# Patient Record
Sex: Male | Born: 1960 | Race: White | Hispanic: No | Marital: Married | Smoking: Never smoker
Health system: Southern US, Community
[De-identification: ages and names within clinical notes are randomized; demographics above are authoritative.]

## PROBLEM LIST (undated history)

## (undated) DIAGNOSIS — M1990 Unspecified osteoarthritis, unspecified site: Secondary | ICD-10-CM

## (undated) DIAGNOSIS — I10 Essential (primary) hypertension: Secondary | ICD-10-CM

## (undated) DIAGNOSIS — E785 Hyperlipidemia, unspecified: Secondary | ICD-10-CM

## (undated) HISTORY — PX: COLONOSCOPY: SHX174

## (undated) HISTORY — DX: Essential (primary) hypertension: I10

## (undated) HISTORY — DX: Hyperlipidemia, unspecified: E78.5

---

## 2004-02-14 ENCOUNTER — Encounter: Admission: RE | Admit: 2004-02-14 | Discharge: 2004-02-14 | Payer: Self-pay | Admitting: Family Medicine

## 2017-08-13 ENCOUNTER — Encounter: Payer: Self-pay | Admitting: Gastroenterology

## 2017-10-22 ENCOUNTER — Encounter: Payer: Self-pay | Admitting: Gastroenterology

## 2017-11-09 ENCOUNTER — Encounter: Payer: Self-pay | Admitting: Gastroenterology

## 2017-12-29 ENCOUNTER — Encounter: Payer: Self-pay | Admitting: Gastroenterology

## 2018-01-18 ENCOUNTER — Encounter: Payer: Self-pay | Admitting: Gastroenterology

## 2018-03-01 ENCOUNTER — Ambulatory Visit (AMBULATORY_SURGERY_CENTER): Payer: Self-pay | Admitting: *Deleted

## 2018-03-01 ENCOUNTER — Other Ambulatory Visit: Payer: Self-pay

## 2018-03-01 VITALS — Ht 68.0 in | Wt 187.4 lb

## 2018-03-01 DIAGNOSIS — Z1211 Encounter for screening for malignant neoplasm of colon: Secondary | ICD-10-CM

## 2018-03-01 MED ORDER — NA SULFATE-K SULFATE-MG SULF 17.5-3.13-1.6 GM/177ML PO SOLN: 1.0000 [IU] | Freq: Once | ORAL | 0 refills | Status: AC

## 2018-03-01 NOTE — Progress Notes (Signed)
No egg or soy allergy known to patient  No issues with past sedation with any surgeries  or procedures, no intubation problems  No diet pills per patient No home 02 use per patient  No blood thinners per patient  Pt denies issues with constipation  No A fib or A flutter  EMMI video sent to pt's e mail  Will watch video with wife.  She is scheduled also.

## 2018-03-03 ENCOUNTER — Encounter: Payer: Self-pay | Admitting: Gastroenterology

## 2018-03-15 ENCOUNTER — Encounter: Payer: Self-pay | Admitting: Gastroenterology

## 2018-03-15 ENCOUNTER — Ambulatory Visit (AMBULATORY_SURGERY_CENTER): Payer: Managed Care, Other (non HMO) | Admitting: Gastroenterology

## 2018-03-15 ENCOUNTER — Other Ambulatory Visit: Payer: Self-pay

## 2018-03-15 VITALS — BP 127/91 | HR 81 | Temp 99.6°F | Resp 12 | Ht 68.0 in | Wt 180.0 lb

## 2018-03-15 DIAGNOSIS — K635 Polyp of colon: Secondary | ICD-10-CM | POA: Diagnosis not present

## 2018-03-15 DIAGNOSIS — Z1211 Encounter for screening for malignant neoplasm of colon: Secondary | ICD-10-CM

## 2018-03-15 DIAGNOSIS — D124 Benign neoplasm of descending colon: Secondary | ICD-10-CM

## 2018-03-15 MED ORDER — SODIUM CHLORIDE 0.9 % IV SOLN: 500.0000 mL | Freq: Once | INTRAVENOUS | Status: AC

## 2018-03-15 NOTE — Op Note (Signed)
[facility] Patient Name: Derrick Tran Procedure Date: 03/15/2018 9:16 AM MRN: [id number] Endoscopist: Mauri Pole , MD Age: 57 Referring MD:  Date of Birth: 1961/01/11 Gender: Male Account #: [id number] Procedure:                Colonoscopy Indications:              Screening for colorectal malignant neoplasm Medicines:                Monitored Anesthesia Care Procedure:                Pre-Anesthesia Assessment:                           - Prior to the procedure, a History and Physical                            was performed, and patient medications and                            allergies were reviewed. The patient's tolerance of                            previous anesthesia was also reviewed. The risks                            and benefits of the procedure and the sedation                            options and risks were discussed with the patient.                            All questions were answered, and informed consent                            was obtained. Prior Anticoagulants: The patient has                            taken no previous anticoagulant or antiplatelet                            agents. ASA Grade Assessment: II - A patient with                            mild systemic disease. After reviewing the risks                            and benefits, the patient was deemed in                            satisfactory condition to undergo the procedure.                           After obtaining informed consent, the colonoscope  was passed under direct vision. Throughout the                            procedure, the patient's blood pressure, pulse, and                            oxygen saturations were monitored continuously. The                            Colonoscope was introduced through the anus and                            advanced to the the cecum, identified by                            appendiceal orifice and  ileocecal valve. The                            colonoscopy was performed without difficulty. The                            patient tolerated the procedure well. The quality                            of the bowel preparation was excellent. The                            ileocecal valve, appendiceal orifice, and rectum                            were photographed. Scope In: 9:19:47 AM Scope Out: 9:39:22 AM Scope Withdrawal Time: 0 hours 17 minutes 0 seconds  Total Procedure Duration: 0 hours 19 minutes 35 seconds  Findings:                 The perianal and digital rectal examinations were                            normal.                           A 5 mm polyp was found in the descending colon. The                            polyp was sessile. The polyp was removed with a                            cold snare. Resection and retrieval were complete.                           Multiple small and large-mouthed diverticula were                            found in the sigmoid colon and descending colon.  Non-bleeding internal hemorrhoids were found during                            retroflexion. The hemorrhoids were small. Complications:            No immediate complications. Estimated Blood Loss:     Estimated blood loss was minimal. Impression:               - One 5 mm polyp in the descending colon, removed                            with a cold snare. Resected and retrieved.                           - Mild diverticulosis in the sigmoid colon and in                            the descending colon.                           - Non-bleeding internal hemorrhoids. Recommendation:           - Patient has a contact number available for                            emergencies. The signs and symptoms of potential                            delayed complications were discussed with the                            patient. Return to normal activities tomorrow.                             Written discharge instructions were provided to the                            patient.                           - Resume previous diet.                           - Continue present medications.                           - Await pathology results.                           - Repeat colonoscopy in 5-10 years for surveillance                            based on pathology results. Mauri Pole, MD 03/15/2018 9:42:44 AM This report has been signed electronically.

## 2018-03-15 NOTE — Progress Notes (Signed)
Report given to PACU, vss 

## 2018-03-15 NOTE — Patient Instructions (Signed)
INFORMATION ON POLYPS, DIVERTICULOSIS, HEMORRHOIDS GIVEN.   YOU HAD AN ENDOSCOPIC PROCEDURE TODAY AT [facility]:   Refer to the procedure report that was given to you for any specific questions about what was found during the examination.  If the procedure report does not answer your questions, please call your gastroenterologist to clarify.  If you requested that your care partner not be given the details of your procedure findings, then the procedure report has been included in a sealed envelope for you to review at your convenience later.  YOU SHOULD EXPECT: Some feelings of bloating in the abdomen. Passage of more gas than usual.  Walking can help get rid of the air that was put into your GI tract during the procedure and reduce the bloating. If you had a lower endoscopy (such as a colonoscopy or flexible sigmoidoscopy) you may notice spotting of blood in your stool or on the toilet paper. If you underwent a bowel prep for your procedure, you may not have a normal bowel movement for a few days.  Please Note:  You might notice some irritation and congestion in your nose or some drainage.  This is from the oxygen used during your procedure.  There is no need for concern and it should clear up in a day or so.  SYMPTOMS TO REPORT IMMEDIATELY:   Following lower endoscopy (colonoscopy or flexible sigmoidoscopy):  Excessive amounts of blood in the stool  Significant tenderness or worsening of abdominal pains  Swelling of the abdomen that is new, acute  Fever of 100F or higher For urgent or emergent issues, a gastroenterologist can be reached at any hour by calling [phone].   DIET:  We do recommend a small meal at first, but then you may proceed to your regular diet.  Drink plenty of fluids but you should avoid alcoholic beverages for 24 hours.  ACTIVITY:  You should plan to take it easy for the rest of today and you should NOT DRIVE or use heavy machinery until tomorrow  (because of the sedation medicines used during the test).    FOLLOW UP: Our staff will call the number listed on your records the next business day following your procedure to check on you and address any questions or concerns that you may have regarding the information given to you following your procedure. If we do not reach you, we will leave a message.  However, if you are feeling well and you are not experiencing any problems, there is no need to return our call.  We will assume that you have returned to your regular daily activities without incident.  If any biopsies were taken you will be contacted by phone or by letter within the next 1-3 weeks.  Please call us at [phone] if you have not heard about the biopsies in 3 weeks.    SIGNATURES/CONFIDENTIALITY: You and/or your care partner have signed paperwork which will be entered into your electronic medical record.  These signatures attest to the fact that that the information above on your After Visit Summary has been reviewed and is understood.  Full responsibility of the confidentiality of this discharge information lies with you and/or your care-partner.

## 2018-03-15 NOTE — Progress Notes (Signed)
Called to room to assist during endoscopic procedure.  Patient ID and intended procedure confirmed with present staff. Received instructions for my participation in the procedure from the performing physician.  

## 2018-03-16 ENCOUNTER — Telehealth: Payer: Self-pay | Admitting: *Deleted

## 2018-03-16 NOTE — Telephone Encounter (Signed)
  Follow up Call-  Call back number 03/15/2018  Post procedure Call Back phone  # [phone]  Permission to leave phone message No  Some recent data might be hidden     Patient questions:  Do you have a fever, pain , or abdominal swelling? No. Pain Score  0 *  Have you tolerated food without any problems? Yes.    Have you been able to return to your normal activities? Yes.    Do you have any questions about your discharge instructions: Diet   No. Medications  No. Follow up visit  No.  Do you have questions or concerns about your Care? No.  Actions: * If pain score is 4 or above: No action needed, pain <4.

## 2018-03-23 ENCOUNTER — Encounter: Payer: Self-pay | Admitting: Gastroenterology

## 2019-06-11 ENCOUNTER — Emergency Department (HOSPITAL_COMMUNITY): Payer: Managed Care, Other (non HMO)

## 2019-06-11 ENCOUNTER — Other Ambulatory Visit: Payer: Self-pay

## 2019-06-11 ENCOUNTER — Emergency Department (HOSPITAL_COMMUNITY)
Admission: EM | Admit: 2019-06-11 | Discharge: 2019-06-11 | Disposition: A | Discharge status: Home / Self Care | Payer: Managed Care, Other (non HMO) | Attending: Emergency Medicine | Admitting: Emergency Medicine

## 2019-06-11 DIAGNOSIS — Z79899 Other long term (current) drug therapy: Secondary | ICD-10-CM | POA: Insufficient documentation

## 2019-06-11 DIAGNOSIS — Z7982 Long term (current) use of aspirin: Secondary | ICD-10-CM | POA: Insufficient documentation

## 2019-06-11 DIAGNOSIS — R05 Cough: Secondary | ICD-10-CM

## 2019-06-11 DIAGNOSIS — Z20822 Contact with and (suspected) exposure to covid-19: Secondary | ICD-10-CM

## 2019-06-11 DIAGNOSIS — R059 Cough, unspecified: Secondary | ICD-10-CM

## 2019-06-11 DIAGNOSIS — Z20828 Contact with and (suspected) exposure to other viral communicable diseases: Secondary | ICD-10-CM | POA: Diagnosis not present

## 2019-06-11 DIAGNOSIS — I10 Essential (primary) hypertension: Secondary | ICD-10-CM | POA: Insufficient documentation

## 2019-06-11 DIAGNOSIS — R0981 Nasal congestion: Secondary | ICD-10-CM | POA: Insufficient documentation

## 2019-06-11 MED ORDER — AZITHROMYCIN 250 MG PO TABS: 250.0000 mg | ORAL_TABLET | Freq: Every day | ORAL | 0 refills | Status: DC

## 2019-06-11 MED ORDER — BENZONATATE 100 MG PO CAPS: 100.0000 mg | ORAL_CAPSULE | Freq: Three times a day (TID) | ORAL | 0 refills | Status: DC

## 2019-06-11 MED ORDER — ALBUTEROL SULFATE HFA 108 (90 BASE) MCG/ACT IN AERS: 1.0000 | INHALATION_SPRAY | Freq: Four times a day (QID) | RESPIRATORY_TRACT | 0 refills | Status: AC | PRN

## 2019-06-11 NOTE — Discharge Instructions (Signed)
Take medications as prescribed.  They will call you with your COVID test in approximately 3 to 4 days.  Return for any chest pain, worsening shortness of breath, coughing up blood, unilateral leg swelling or redness.

## 2019-06-11 NOTE — ED Triage Notes (Signed)
Pt arrives c/o cough, chest congestion, SOB worsening over last two weeks. Pt states his daughter has been sick but tested negative for COVID.

## 2019-06-11 NOTE — ED Provider Notes (Signed)
[facility] Provider Note   CSN: [id number] Arrival date & time: 06/11/19  1557   History   Chief Complaint Chief Complaint  Patient presents with   Cough   Shortness of Breath   HPI [name] is a 58 y.o. male with past medical history significant for HTN, hyperlipidemia who presents for evaluation of cough and chest congestion.  Patient states he has had cough productive of clear sputum as well as chest congestion over the last 2 weeks.  Patient states he will get into "coughing attacks which forces him to become short of breath however this is only occurred twice over the last 2 weeks.  Was seen by telemedicine visit and prescribed Augmentin.  He has not had prior chest x-ray.  States his daughter whom he lives with has similar symptoms.  His daughter did had COVID testing last week however this was negative.  Has had some mild congestion and rhinorrhea.  Denies fever, chills, nausea, vomiting, sore throat, neck pain, neck stiffness, chest pain, hemoptysis, abdominal pain, diarrhea, dysuria, lightheadedness, dizziness, diaphoresis.  No history of PE or DVT.  Denies recent travel, immobilization, surgery, history of malignancy or clotting disorders.  Denies additional aggravating or alleviating factors.  Has been taking Mucinex at home with mild relief of his symptoms.  Denies PND, orthopnea, history of CHF.  History obtained from patient, wife in room and past medical records.  No interpreter was used.    HPI  Past Medical History:  Diagnosis Date   Hyperlipidemia    Hypertension     There are no active problems to display for this patient.   No past surgical history on file.      Home Medications    Prior to Admission medications   Medication Sig Start Date End Date Taking? Authorizing Provider  albuterol (VENTOLIN HFA) 108 (90 Base) MCG/ACT inhaler Inhale 1-2 puffs into the lungs every 6 (six) hours as needed for wheezing or  shortness of breath. 06/11/19   Amaranta Mehl A, PA-C  aspirin EC 81 MG tablet Take 81 mg by mouth daily.    [provider]  atorvastatin (LIPITOR) 40 MG tablet Take 1 tablet by mouth daily. 01/16/18   [provider]  azithromycin (ZITHROMAX) 250 MG tablet Take 1 tablet (250 mg total) by mouth daily. Take first 2 tablets together, then 1 every day until finished. 06/11/19   Siniyah Evangelist A, PA-C  benzonatate (TESSALON) 100 MG capsule Take 1 capsule (100 mg total) by mouth every 8 (eight) hours. 06/11/19   Tashona Calk A, PA-C  Biotin (BIOTIN 5000) 5 MG CAPS Take 1 capsule by mouth daily.    [provider]  calcium gluconate 500 MG tablet Take 1 tablet by mouth daily.    [provider]  finasteride (PROPECIA) 1 MG tablet Take 1 mg by mouth daily.    [provider]  lisinopril (PRINIVIL,ZESTRIL) 40 MG tablet Take 1 tablet by mouth daily. 06/08/17   [provider]  Omega-3 Fatty Acids (FISH OIL) 1000 MG CAPS Take 1 capsule by mouth daily.    [provider]    Family History Family History  Problem Relation Age of Onset   Lung cancer Father    High blood pressure Father    Colon cancer Neg Hx    Colon polyps Neg Hx    Esophageal cancer Neg Hx    Rectal cancer Neg Hx    Stomach cancer Neg Hx  Social History Social History   Tobacco Use   Smoking status: Never Smoker   Smokeless tobacco: Never Used  Substance Use Topics   Alcohol use: Yes    Alcohol/week: 2.0 standard drinks    Types: 2 Cans of beer per week   Drug use: Never     Allergies   Patient has no known allergies.   Review of Systems Review of Systems  Constitutional: Negative.   HENT: Positive for congestion, postnasal drip and rhinorrhea. Negative for ear discharge, ear pain, facial swelling, sinus pressure, sinus pain, sneezing, sore throat and trouble swallowing.   Respiratory: Positive for cough. Negative for apnea, choking,  chest tightness, shortness of breath (with coughing 2 x over last 2 weeks), wheezing and stridor.   Cardiovascular: Negative.   Gastrointestinal: Negative.   Genitourinary: Negative.   Musculoskeletal: Negative.   Skin: Negative.   Neurological: Negative.   All other systems reviewed and are negative.    Physical Exam Updated Vital Signs BP 120/88 (BP Location: Right Arm)    Pulse 88    Temp 98.2 F (36.8 C) (Oral)    Resp 18    Ht 5\' 8"  (1.727 m)    Wt 81.6 kg    SpO2 97%    BMI 27.37 kg/m   Physical Exam Vitals signs and nursing note reviewed.  Constitutional:      General: He is not in acute distress.    Appearance: He is not ill-appearing, toxic-appearing or diaphoretic.  HENT:     Head: Normocephalic and atraumatic.     Jaw: There is normal jaw occlusion.     Right Ear: Tympanic membrane, ear canal and external ear normal. There is no impacted cerumen. No hemotympanum. Tympanic membrane is not injected, scarred, perforated, erythematous, retracted or bulging.     Left Ear: Tympanic membrane, ear canal and external ear normal. There is no impacted cerumen. No hemotympanum. Tympanic membrane is not injected, scarred, perforated, erythematous, retracted or bulging.     Nose: Mucosal edema and rhinorrhea present.     Comments: Clear rhinorrhea and congestion to bilateral nares.  No sinus tenderness.    Mouth/Throat:     Lips: Pink.     Mouth: Mucous membranes are moist.     Pharynx: Oropharynx is clear. Uvula midline.     Comments: Posterior oropharynx clear.  Mucous membranes moist.  Tonsils without erythema or exudate.  Uvula midline without deviation.  No evidence of PTA or RPA.  No drooling, dysphasia or trismus.  Phonation normal. Neck:     Trachea: Trachea and phonation normal.     Meningeal: Brudzinski's sign and Kernig's sign absent.     Comments: No Neck stiffness or neck rigidity.  No meningismus.  No cervical lymphadenopathy. Cardiovascular:     Rate and Rhythm:  Normal rate.     Pulses: Normal pulses.     Heart sounds: Normal heart sounds.     Comments: No murmurs rubs or gallops. Pulmonary:     Effort: Pulmonary effort is normal.     Breath sounds: Normal breath sounds.     Comments: Clear to auscultation bilaterally without wheeze, rhonchi or rales.  No accessory muscle usage.  Able speak in full sentences. Abdominal:     Comments: Soft, nontender without rebound or guarding.  No CVA tenderness.  Musculoskeletal:     Comments: Moves all 4 extremities without difficulty.  Lower extremities without edema, erythema or warmth.  Skin:    Comments: Brisk capillary refill.  No rashes or lesions.  Neurological:     Mental Status: He is alert.     Comments: Ambulatory in department without difficulty.  Cranial nerves II through XII grossly intact.  No facial droop.  No aphasia.      ED Treatments / Results  Labs (all labs ordered are listed, but only abnormal results are displayed) Labs Reviewed  NOVEL CORONAVIRUS, NAA (HOSP ORDER, SEND-OUT TO REF LAB; TAT 18-24 HRS)    EKG EKG Interpretation  Date/Time:  Saturday June 11 2019 16:58:49 EDT Ventricular Rate:  91 PR Interval:  158 QRS Duration: 84 QT Interval:  358 QTC Calculation: 440 R Axis:     Text Interpretation:  Normal sinus rhythm Normal ECG No old tracing to compare Confirmed by Malvin Johns [phone]) on 06/11/2019 5:40:53 PM   Radiology Dg Chest Portable 1 View  Result Date: 06/11/2019 CLINICAL DATA:  Cough.  Shortness of breath. EXAM: PORTABLE CHEST 1 VIEW COMPARISON:  None. FINDINGS: The heart size is mildly enlarged. There are linear opacities at the lung bases bilaterally, left greater than right. No pneumothorax. No large pleural effusion. No acute osseous abnormality. IMPRESSION: 1. No acute cardiopulmonary process. 2. Linear opacities at the left lung base favored to represent atelectasis or scarring. Electronically Signed   By: Constance Holster M.D.   On: 06/11/2019  16:56   Procedures Procedures (including critical care time)  Medications Ordered in ED Medications - No data to display  Initial Impression / Assessment and Plan / ED Course  I have reviewed the triage vital signs and the nursing notes.  Pertinent labs & imaging results that were available during my care of the patient were reviewed by me and considered in my medical decision making (see chart for details).  58 year old male appears otherwise well presents for evaluation of cough and chest congestion.  Daughter at home with similar symptoms.  Not been tested previously for COVID.  Cough productive of clear sputum.  Also has congestion and rhinorrhea.  Denies any chest pain, diaphoresis, lightheadedness, dizziness.  Patient states he did have 2 "coughing attacks" over the last 2 weeks where he became short of breath because he was "coughing so bad."  He denies any history of PE, DVT.  He has no risk factors for PE and does not have evidence of DVT on exam.  No hemoptysis, tolerating p.o. intake at home without difficulty.  No prior history of CHF, denies PND orthopnea.  Patient appears overall well.  He is afebrile, nonseptic, non-ill-appearing.  He has no tachycardia, tachypnea or hypoxia.  Ambulates in room with oxygen saturations greater than 97% on room air.  Chest x-ray with bilateral linear opacities at bases however no gross infiltrates, pneumothorax, cardiomegaly, pulmonary edema.  Will DC home with supportive treatment.  Likely viral upper respiratory infection.  Will obtain COVID testing.  Low suspicion for atypical ACS, PE, dissection, myocarditis, pericarditis, pneumothorax, bacterial infection as cause of his cough. PERC negative, Well criteria low risk. Will DC home with albuterol inhaler for bronchospasms.  Discussed return precautions with patient.  Patient voiced understanding and is agreeable for follow-up.  The patient has been appropriately medically screened and/or stabilized in  the ED. I have low suspicion for any other emergent medical condition which would require further screening, evaluation or treatment in the ED or require inpatient management.   MUBARAK PELON was evaluated in Emergency Department on 06/11/2019 for the symptoms described in the history of present illness. He was evaluated in the  context of the global COVID-19 pandemic, which necessitated consideration that the patient might be at risk for infection with the SARS-CoV-2 virus that causes COVID-19. Institutional protocols and algorithms that pertain to the evaluation of patients at risk for COVID-19 are in a state of rapid change based on information released by regulatory bodies including the CDC and federal and state organizations. These policies and algorithms were followed during the patient's care in the ED.      Final Clinical Impressions(s) / ED Diagnoses   Final diagnoses:  Cough  Suspected Covid-19 Virus Infection    ED Discharge Orders         Ordered    azithromycin (ZITHROMAX) 250 MG tablet  Daily     06/11/19 1724    benzonatate (TESSALON) 100 MG capsule  Every 8 hours     06/11/19 1724    albuterol (VENTOLIN HFA) 108 (90 Base) MCG/ACT inhaler  Every 6 hours PRN     06/11/19 1724           Taeler Winning A, PA-C 06/11/19 1747    Malvin Johns, MD 06/11/19 1925

## 2019-06-12 LAB — NOVEL CORONAVIRUS, NAA (HOSP ORDER, SEND-OUT TO REF LAB; TAT 18-24 HRS): SARS-CoV-2, NAA: NOT DETECTED

## 2019-07-01 ENCOUNTER — Other Ambulatory Visit: Payer: Self-pay | Admitting: Emergency Medicine

## 2020-03-17 ENCOUNTER — Emergency Department (HOSPITAL_COMMUNITY): Payer: Managed Care, Other (non HMO)

## 2020-03-17 ENCOUNTER — Observation Stay (HOSPITAL_COMMUNITY)
Admission: EM | Admit: 2020-03-17 | Discharge: 2020-03-18 | Disposition: A | Discharge status: Home / Self Care | Payer: Managed Care, Other (non HMO) | Attending: Orthopedic Surgery | Admitting: Orthopedic Surgery

## 2020-03-17 ENCOUNTER — Observation Stay (HOSPITAL_COMMUNITY): Payer: Managed Care, Other (non HMO)

## 2020-03-17 ENCOUNTER — Emergency Department (HOSPITAL_COMMUNITY): Payer: Managed Care, Other (non HMO) | Admitting: Anesthesiology

## 2020-03-17 ENCOUNTER — Encounter (HOSPITAL_COMMUNITY)
Admission: EM | Disposition: A | Discharge status: Home / Self Care | Payer: Self-pay | Source: Home / Self Care | Attending: Emergency Medicine

## 2020-03-17 ENCOUNTER — Other Ambulatory Visit: Payer: Self-pay

## 2020-03-17 DIAGNOSIS — Z801 Family history of malignant neoplasm of trachea, bronchus and lung: Secondary | ICD-10-CM | POA: Diagnosis not present

## 2020-03-17 DIAGNOSIS — Z9889 Other specified postprocedural states: Secondary | ICD-10-CM

## 2020-03-17 DIAGNOSIS — E785 Hyperlipidemia, unspecified: Secondary | ICD-10-CM | POA: Diagnosis not present

## 2020-03-17 DIAGNOSIS — Z20822 Contact with and (suspected) exposure to covid-19: Secondary | ICD-10-CM | POA: Diagnosis not present

## 2020-03-17 DIAGNOSIS — S42202A Unspecified fracture of upper end of left humerus, initial encounter for closed fracture: Secondary | ICD-10-CM | POA: Diagnosis present

## 2020-03-17 DIAGNOSIS — I10 Essential (primary) hypertension: Secondary | ICD-10-CM | POA: Insufficient documentation

## 2020-03-17 DIAGNOSIS — S43004A Unspecified dislocation of right shoulder joint, initial encounter: Secondary | ICD-10-CM

## 2020-03-17 DIAGNOSIS — Z8249 Family history of ischemic heart disease and other diseases of the circulatory system: Secondary | ICD-10-CM | POA: Insufficient documentation

## 2020-03-17 DIAGNOSIS — S7002XA Contusion of left hip, initial encounter: Secondary | ICD-10-CM | POA: Insufficient documentation

## 2020-03-17 DIAGNOSIS — S42309A Unspecified fracture of shaft of humerus, unspecified arm, initial encounter for closed fracture: Secondary | ICD-10-CM | POA: Diagnosis present

## 2020-03-17 DIAGNOSIS — Z8781 Personal history of (healed) traumatic fracture: Secondary | ICD-10-CM

## 2020-03-17 HISTORY — PX: ORIF HUMERUS FRACTURE: SHX2126

## 2020-03-17 LAB — CBC WITH DIFFERENTIAL/PLATELET
Abs Immature Granulocytes: 0.19 10*3/uL — ABNORMAL HIGH (ref 0.00–0.07)
Basophils Absolute: 0.1 10*3/uL (ref 0.0–0.1)
Basophils Relative: 0 %
Eosinophils Absolute: 0 10*3/uL (ref 0.0–0.5)
Eosinophils Relative: 0 %
HCT: 44.4 % (ref 39.0–52.0)
Hemoglobin: 15.2 g/dL (ref 13.0–17.0)
Immature Granulocytes: 1 %
Lymphocytes Relative: 8 %
Lymphs Abs: 1.8 10*3/uL (ref 0.7–4.0)
MCH: 34.2 pg — ABNORMAL HIGH (ref 26.0–34.0)
MCHC: 34.2 g/dL (ref 30.0–36.0)
MCV: 100 fL (ref 80.0–100.0)
Monocytes Absolute: 1.2 10*3/uL — ABNORMAL HIGH (ref 0.1–1.0)
Monocytes Relative: 5 %
Neutro Abs: 19.7 10*3/uL — ABNORMAL HIGH (ref 1.7–7.7)
Neutrophils Relative %: 86 %
Platelets: 320 10*3/uL (ref 150–400)
RBC: 4.44 MIL/uL (ref 4.22–5.81)
RDW: 11.9 % (ref 11.5–15.5)
WBC: 22.9 10*3/uL — ABNORMAL HIGH (ref 4.0–10.5)
nRBC: 0 % (ref 0.0–0.2)

## 2020-03-17 LAB — BASIC METABOLIC PANEL
Anion gap: 12 (ref 5–15)
BUN: 13 mg/dL (ref 6–20)
CO2: 24 mmol/L (ref 22–32)
Calcium: 8.9 mg/dL (ref 8.9–10.3)
Chloride: 100 mmol/L (ref 98–111)
Creatinine, Ser: 0.76 mg/dL (ref 0.61–1.24)
GFR calc Af Amer: >60 mL/min (ref >60)
GFR calc non Af Amer: >60 mL/min (ref >60)
Glucose, Bld: 118 mg/dL — ABNORMAL HIGH (ref 70–99)
Potassium: 3.7 mmol/L (ref 3.5–5.1)
Sodium: 136 mmol/L (ref 135–145)

## 2020-03-17 LAB — PROTIME-INR
INR: 1 (ref 0.8–1.2)
Prothrombin Time: 12.2 seconds (ref 11.4–15.2)

## 2020-03-17 LAB — SARS CORONAVIRUS 2 BY RT PCR (HOSPITAL ORDER, PERFORMED IN ~~LOC~~ HOSPITAL LAB): SARS Coronavirus 2: NEGATIVE

## 2020-03-17 SURGERY — OPEN REDUCTION INTERNAL FIXATION (ORIF) DISTAL HUMERUS FRACTURE
Anesthesia: General | Site: Shoulder | Laterality: Left

## 2020-03-17 MED ORDER — MIDAZOLAM HCL 5 MG/5ML IJ SOLN
INTRAMUSCULAR | Status: DC | PRN
  Administered 2020-03-17 (×2): 1 mg via INTRAVENOUS

## 2020-03-17 MED ORDER — MORPHINE SULFATE (PF) 4 MG/ML IV SOLN
4.0000 mg | Freq: Once | INTRAVENOUS | Status: AC
  Administered 2020-03-17: 4 mg via INTRAVENOUS
  Filled 2020-03-17: qty 1

## 2020-03-17 MED ORDER — PHENYLEPHRINE 40 MCG/ML (10ML) SYRINGE FOR IV PUSH (FOR BLOOD PRESSURE SUPPORT)
PREFILLED_SYRINGE | INTRAVENOUS | Status: DC | PRN
  Administered 2020-03-17 (×3): 120 ug via INTRAVENOUS
  Administered 2020-03-17: 40 ug via INTRAVENOUS

## 2020-03-17 MED ORDER — DEXAMETHASONE SODIUM PHOSPHATE 10 MG/ML IJ SOLN
INTRAMUSCULAR | Status: AC
  Filled 2020-03-17: qty 1

## 2020-03-17 MED ORDER — ASPIRIN EC 325 MG PO TBEC
325.0000 mg | DELAYED_RELEASE_TABLET | Freq: Every day | ORAL | Status: DC
  Administered 2020-03-18: 325 mg via ORAL
  Filled 2020-03-17: qty 1

## 2020-03-17 MED ORDER — POVIDONE-IODINE 10 % EX SWAB: 2.0000 "application " | Freq: Once | CUTANEOUS | Status: DC

## 2020-03-17 MED ORDER — CEFAZOLIN SODIUM-DEXTROSE 2-4 GM/100ML-% IV SOLN
2.0000 g | INTRAVENOUS | Status: AC
  Administered 2020-03-17: 2 g via INTRAVENOUS

## 2020-03-17 MED ORDER — ONDANSETRON HCL 4 MG/2ML IJ SOLN: 4.0000 mg | Freq: Once | INTRAMUSCULAR | Status: DC | PRN

## 2020-03-17 MED ORDER — ONDANSETRON HCL 4 MG PO TABS: 4.0000 mg | ORAL_TABLET | Freq: Four times a day (QID) | ORAL | Status: DC | PRN

## 2020-03-17 MED ORDER — DIPHENHYDRAMINE HCL 12.5 MG/5ML PO ELIX: 12.5000 mg | ORAL_SOLUTION | ORAL | Status: DC | PRN

## 2020-03-17 MED ORDER — CEFAZOLIN SODIUM-DEXTROSE 1-4 GM/50ML-% IV SOLN
1.0000 g | Freq: Four times a day (QID) | INTRAVENOUS | Status: AC
  Administered 2020-03-17 – 2020-03-18 (×3): 1 g via INTRAVENOUS
  Filled 2020-03-17 (×3): qty 50

## 2020-03-17 MED ORDER — DEXAMETHASONE SODIUM PHOSPHATE 10 MG/ML IJ SOLN
INTRAMUSCULAR | Status: DC | PRN
  Administered 2020-03-17: 10 mg via INTRAVENOUS

## 2020-03-17 MED ORDER — LIDOCAINE 2% (20 MG/ML) 5 ML SYRINGE
INTRAMUSCULAR | Status: DC | PRN
  Administered 2020-03-17: 20 mg via INTRAVENOUS

## 2020-03-17 MED ORDER — ONDANSETRON HCL 4 MG/2ML IJ SOLN
4.0000 mg | Freq: Four times a day (QID) | INTRAMUSCULAR | Status: DC | PRN
  Administered 2020-03-17: 4 mg via INTRAVENOUS

## 2020-03-17 MED ORDER — SODIUM CHLORIDE 0.9 % IR SOLN
Status: DC | PRN
  Administered 2020-03-17: 1000 mL

## 2020-03-17 MED ORDER — BUPIVACAINE-EPINEPHRINE 0.5% -1:200000 IJ SOLN
INTRAMUSCULAR | Status: AC
  Filled 2020-03-17: qty 1

## 2020-03-17 MED ORDER — EPHEDRINE 5 MG/ML INJ
INTRAVENOUS | Status: AC
  Filled 2020-03-17: qty 10

## 2020-03-17 MED ORDER — METHOCARBAMOL 500 MG IVPB - SIMPLE MED
500.0000 mg | Freq: Four times a day (QID) | INTRAVENOUS | Status: DC | PRN
  Filled 2020-03-17: qty 50

## 2020-03-17 MED ORDER — ROCURONIUM BROMIDE 10 MG/ML (PF) SYRINGE
PREFILLED_SYRINGE | INTRAVENOUS | Status: DC | PRN
  Administered 2020-03-17: 30 mg via INTRAVENOUS
  Administered 2020-03-17: 50 mg via INTRAVENOUS

## 2020-03-17 MED ORDER — METOCLOPRAMIDE HCL 5 MG/ML IJ SOLN: 5.0000 mg | Freq: Three times a day (TID) | INTRAMUSCULAR | Status: DC | PRN

## 2020-03-17 MED ORDER — PHENYLEPHRINE HCL (PRESSORS) 10 MG/ML IV SOLN
INTRAVENOUS | Status: AC
  Filled 2020-03-17: qty 1

## 2020-03-17 MED ORDER — BUPIVACAINE LIPOSOME 1.3 % IJ SUSP
INTRAMUSCULAR | Status: DC | PRN
  Administered 2020-03-17: 10 mL via PERINEURAL

## 2020-03-17 MED ORDER — LACTATED RINGERS IV SOLN
INTRAVENOUS | Status: DC | PRN
Start: 2020-03-17 — End: 2020-03-17

## 2020-03-17 MED ORDER — ALUM & MAG HYDROXIDE-SIMETH 200-200-20 MG/5ML PO SUSP: 15.0000 mL | ORAL | Status: DC | PRN

## 2020-03-17 MED ORDER — OXYCODONE HCL 5 MG PO TABS
5.0000 mg | ORAL_TABLET | ORAL | Status: DC | PRN
  Administered 2020-03-18: 5 mg via ORAL
  Filled 2020-03-17: qty 2

## 2020-03-17 MED ORDER — PROPOFOL 10 MG/ML IV BOLUS
INTRAVENOUS | Status: AC
  Filled 2020-03-17: qty 40

## 2020-03-17 MED ORDER — BISACODYL 5 MG PO TBEC: 5.0000 mg | DELAYED_RELEASE_TABLET | Freq: Every day | ORAL | Status: DC | PRN

## 2020-03-17 MED ORDER — SUGAMMADEX SODIUM 200 MG/2ML IV SOLN
INTRAVENOUS | Status: DC | PRN
  Administered 2020-03-17: 200 mg via INTRAVENOUS

## 2020-03-17 MED ORDER — ONDANSETRON HCL 4 MG/2ML IJ SOLN
INTRAMUSCULAR | Status: AC
  Filled 2020-03-17: qty 2

## 2020-03-17 MED ORDER — FENTANYL CITRATE (PF) 250 MCG/5ML IJ SOLN
INTRAMUSCULAR | Status: AC
  Filled 2020-03-17: qty 5

## 2020-03-17 MED ORDER — METOCLOPRAMIDE HCL 5 MG PO TABS
5.0000 mg | ORAL_TABLET | Freq: Three times a day (TID) | ORAL | Status: DC | PRN
  Administered 2020-03-18: 5 mg via ORAL
  Filled 2020-03-17: qty 1

## 2020-03-17 MED ORDER — OXYCODONE HCL 5 MG PO TABS: 10.0000 mg | ORAL_TABLET | ORAL | Status: DC | PRN

## 2020-03-17 MED ORDER — HYDROMORPHONE HCL 1 MG/ML IJ SOLN: 0.5000 mg | INTRAMUSCULAR | Status: DC | PRN

## 2020-03-17 MED ORDER — DOCUSATE SODIUM 100 MG PO CAPS
100.0000 mg | ORAL_CAPSULE | Freq: Two times a day (BID) | ORAL | Status: DC
  Administered 2020-03-17 – 2020-03-18 (×2): 100 mg via ORAL
  Filled 2020-03-17 (×2): qty 1

## 2020-03-17 MED ORDER — FENTANYL CITRATE (PF) 100 MCG/2ML IJ SOLN: 25.0000 ug | INTRAMUSCULAR | Status: DC | PRN

## 2020-03-17 MED ORDER — MENTHOL 3 MG MT LOZG: 1.0000 | LOZENGE | OROMUCOSAL | Status: DC | PRN

## 2020-03-17 MED ORDER — ONDANSETRON HCL 4 MG/2ML IJ SOLN
INTRAMUSCULAR | Status: DC | PRN
  Administered 2020-03-17: 4 mg via INTRAVENOUS

## 2020-03-17 MED ORDER — CEFAZOLIN SODIUM-DEXTROSE 2-4 GM/100ML-% IV SOLN
INTRAVENOUS | Status: AC
  Filled 2020-03-17: qty 100

## 2020-03-17 MED ORDER — LIDOCAINE 2% (20 MG/ML) 5 ML SYRINGE
INTRAMUSCULAR | Status: AC
  Filled 2020-03-17: qty 5

## 2020-03-17 MED ORDER — FINASTERIDE 1 MG PO TABS: 1.0000 mg | ORAL_TABLET | Freq: Every day | ORAL | Status: DC

## 2020-03-17 MED ORDER — LISINOPRIL 20 MG PO TABS
40.0000 mg | ORAL_TABLET | Freq: Every day | ORAL | Status: DC
  Administered 2020-03-18: 40 mg via ORAL
  Filled 2020-03-17: qty 2

## 2020-03-17 MED ORDER — PHENYLEPHRINE 40 MCG/ML (10ML) SYRINGE FOR IV PUSH (FOR BLOOD PRESSURE SUPPORT)
PREFILLED_SYRINGE | INTRAVENOUS | Status: AC
  Filled 2020-03-17: qty 10

## 2020-03-17 MED ORDER — PROPOFOL 10 MG/ML IV BOLUS
INTRAVENOUS | Status: DC | PRN
  Administered 2020-03-17: 150 mg via INTRAVENOUS

## 2020-03-17 MED ORDER — FENTANYL CITRATE (PF) 250 MCG/5ML IJ SOLN
INTRAMUSCULAR | Status: DC | PRN
  Administered 2020-03-17 (×3): 50 ug via INTRAVENOUS

## 2020-03-17 MED ORDER — CHLORHEXIDINE GLUCONATE 4 % EX LIQD: 60.0000 mL | Freq: Once | CUTANEOUS | Status: DC

## 2020-03-17 MED ORDER — PHENYLEPHRINE HCL-NACL 10-0.9 MG/250ML-% IV SOLN
INTRAVENOUS | Status: DC | PRN
  Administered 2020-03-17: 50 ug/min via INTRAVENOUS

## 2020-03-17 MED ORDER — MIDAZOLAM HCL 2 MG/2ML IJ SOLN
INTRAMUSCULAR | Status: AC
  Filled 2020-03-17: qty 2

## 2020-03-17 MED ORDER — ZOLPIDEM TARTRATE 5 MG PO TABS: 5.0000 mg | ORAL_TABLET | Freq: Every evening | ORAL | Status: DC | PRN

## 2020-03-17 MED ORDER — BUPIVACAINE-EPINEPHRINE (PF) 0.5% -1:200000 IJ SOLN
INTRAMUSCULAR | Status: DC | PRN
Start: 2020-03-17 — End: 2020-03-17
  Administered 2020-03-17: 15 mL via PERINEURAL

## 2020-03-17 MED ORDER — METHOCARBAMOL 500 MG PO TABS: 500.0000 mg | ORAL_TABLET | Freq: Four times a day (QID) | ORAL | Status: DC | PRN

## 2020-03-17 MED ORDER — ACETAMINOPHEN 325 MG PO TABS: 325.0000 mg | ORAL_TABLET | Freq: Four times a day (QID) | ORAL | Status: DC | PRN

## 2020-03-17 MED ORDER — ACETAMINOPHEN 500 MG PO TABS
1000.0000 mg | ORAL_TABLET | Freq: Four times a day (QID) | ORAL | Status: DC
  Administered 2020-03-17 – 2020-03-18 (×2): 1000 mg via ORAL
  Filled 2020-03-17 (×2): qty 2

## 2020-03-17 MED ORDER — EPHEDRINE SULFATE-NACL 50-0.9 MG/10ML-% IV SOSY
PREFILLED_SYRINGE | INTRAVENOUS | Status: DC | PRN
  Administered 2020-03-17: 10 mg via INTRAVENOUS

## 2020-03-17 MED ORDER — PHENOL 1.4 % MT LIQD: 1.0000 | OROMUCOSAL | Status: DC | PRN

## 2020-03-17 MED ORDER — ATORVASTATIN CALCIUM 40 MG PO TABS
40.0000 mg | ORAL_TABLET | Freq: Every day | ORAL | Status: DC
  Administered 2020-03-18: 40 mg via ORAL
  Filled 2020-03-17: qty 1

## 2020-03-17 MED ORDER — SODIUM CHLORIDE 0.9 % IV SOLN: INTRAVENOUS | Status: DC

## 2020-03-17 MED ORDER — SENNOSIDES-DOCUSATE SODIUM 8.6-50 MG PO TABS: 1.0000 | ORAL_TABLET | Freq: Every evening | ORAL | Status: DC | PRN

## 2020-03-17 MED ORDER — HYDROMORPHONE HCL 1 MG/ML IJ SOLN: 1.0000 mg | Freq: Once | INTRAMUSCULAR | Status: DC

## 2020-03-17 MED ORDER — SODIUM CHLORIDE 0.9 % IV SOLN: Freq: Once | INTRAVENOUS | Status: DC

## 2020-03-17 MED ORDER — ROCURONIUM BROMIDE 10 MG/ML (PF) SYRINGE
PREFILLED_SYRINGE | INTRAVENOUS | Status: AC
  Filled 2020-03-17: qty 10

## 2020-03-17 SURGICAL SUPPLY — 78 items
BAG ZIPLOCK 12X15 (MISCELLANEOUS) ×3 IMPLANT
BENZOIN TINCTURE PRP APPL 2/3 (GAUZE/BANDAGES/DRESSINGS) ×3 IMPLANT
BIT DRILL 3.2 (BIT) ×3
BIT DRILL 3.2XCALB NS DISP (BIT) ×1 IMPLANT
BIT DRILL CALIBRATED 2.7 (BIT) ×2 IMPLANT
BIT DRILL CALIBRATED 2.7MM (BIT) ×1
BIT DRL 3.2XCALB NS DISP (BIT) ×1
BNDG ESMARK 4X9 LF (GAUZE/BANDAGES/DRESSINGS) ×3 IMPLANT
BNDG GAUZE ELAST 4 BULKY (GAUZE/BANDAGES/DRESSINGS) ×6 IMPLANT
CLEANER TIP ELECTROSURG 2X2 (MISCELLANEOUS) ×3 IMPLANT
CORD BIPOLAR FORCEPS 12FT (ELECTRODE) ×3 IMPLANT
COVER SURGICAL LIGHT HANDLE (MISCELLANEOUS) ×3 IMPLANT
COVER WAND RF STERILE (DRAPES) IMPLANT
CUFF TOURN SGL QUICK 18X4 (TOURNIQUET CUFF) ×3 IMPLANT
CUFF TOURN SGL QUICK 24 (TOURNIQUET CUFF) ×2
CUFF TRNQT CYL 24X4X16.5-23 (TOURNIQUET CUFF) ×1 IMPLANT
DRAPE C-ARM 42X120 X-RAY (DRAPES) ×3 IMPLANT
DRAPE INCISE IOBAN 66X45 STRL (DRAPES) ×3 IMPLANT
DRAPE ORTHO SPLIT 77X108 STRL (DRAPES) ×2
DRAPE POUCH INSTRU U-SHP 10X18 (DRAPES) ×3 IMPLANT
DRAPE SHEET LG 3/4 BI-LAMINATE (DRAPES) ×3 IMPLANT
DRAPE SURG 17X11 SM STRL (DRAPES) ×6 IMPLANT
DRAPE SURG ORHT 6 SPLT 77X108 (DRAPES) ×1 IMPLANT
DRAPE U-SHAPE 47X51 STRL (DRAPES) ×6 IMPLANT
DRESSING AQUACEL AG SP 3.5X10 (GAUZE/BANDAGES/DRESSINGS) ×1 IMPLANT
DRSG ADAPTIC 3X8 NADH LF (GAUZE/BANDAGES/DRESSINGS) ×3 IMPLANT
DRSG AQUACEL AG SP 3.5X10 (GAUZE/BANDAGES/DRESSINGS) ×3
DRSG PAD ABDOMINAL 8X10 ST (GAUZE/BANDAGES/DRESSINGS) ×3 IMPLANT
DURAPREP 26ML APPLICATOR (WOUND CARE) ×3 IMPLANT
ELECT REM PT RETURN 15FT ADLT (MISCELLANEOUS) ×3 IMPLANT
EVACUATOR 1/8 PVC DRAIN (DRAIN) IMPLANT
GAUZE SPONGE 4X4 12PLY STRL (GAUZE/BANDAGES/DRESSINGS) ×6 IMPLANT
GAUZE XEROFORM 1X8 LF (GAUZE/BANDAGES/DRESSINGS) ×3 IMPLANT
GLOVE BIO SURGEON STRL SZ7.5 (GLOVE) ×12 IMPLANT
GLOVE BIOGEL PI IND STRL 7.0 (GLOVE) ×1 IMPLANT
GLOVE BIOGEL PI IND STRL 7.5 (GLOVE) ×1 IMPLANT
GLOVE BIOGEL PI IND STRL 8 (GLOVE) ×2 IMPLANT
GLOVE BIOGEL PI INDICATOR 7.0 (GLOVE) ×2
GLOVE BIOGEL PI INDICATOR 7.5 (GLOVE) ×2
GLOVE BIOGEL PI INDICATOR 8 (GLOVE) ×4
GOWN STRL REUS W/TWL LRG LVL3 (GOWN DISPOSABLE) ×6 IMPLANT
K-WIRE 2X5 SS THRDED S3 (WIRE) ×3
KIT BASIN (CUSTOM PROCEDURE TRAY) ×3 IMPLANT
KIT TURNOVER KIT A (KITS) IMPLANT
KWIRE 2X5 SS THRDED S3 (WIRE) ×1 IMPLANT
LOOP VESSEL MAXI BLUE (MISCELLANEOUS) ×3 IMPLANT
MANIFOLD NEPTUNE II (INSTRUMENTS) ×3 IMPLANT
NEEDLE HYPO 25X1 1.5 SAFETY (NEEDLE) ×3 IMPLANT
NEEDLE MA TROC 1/2 CIR (NEEDLE) ×3 IMPLANT
NS IRRIG 1000ML POUR BTL (IV SOLUTION) ×3 IMPLANT
PACK SHOULDER (CUSTOM PROCEDURE TRAY) ×3 IMPLANT
PACK TOTAL JOINT (CUSTOM PROCEDURE TRAY) IMPLANT
PEG LOCKING 3.2MMX46 (Peg) ×3 IMPLANT
PEG LOCKING 3.2X42 (Screw) ×6 IMPLANT
PEG LOCKING 3.2X50 (Screw) ×3 IMPLANT
PEG LOCKING 3.2X58MM (Peg) ×6 IMPLANT
PENCIL SMOKE EVACUATOR (MISCELLANEOUS) IMPLANT
PLATE 3HOLE HUMERUS PROX RT (Plate) ×3 IMPLANT
PROTECTOR NERVE ULNAR (MISCELLANEOUS) ×3 IMPLANT
SCREW LOCK CORT STAR 3.5X28 (Screw) ×6 IMPLANT
SCREW LOW PROF TIS 3.5X28MM (Screw) ×3 IMPLANT
SLING ARM FOAM STRAP LRG (SOFTGOODS) ×3 IMPLANT
SPONGE LAP 18X18 RF (DISPOSABLE) ×3 IMPLANT
STAPLER VISISTAT 35W (STAPLE) ×3 IMPLANT
STOCKINETTE 8 INCH (MISCELLANEOUS) IMPLANT
SUCTION FRAZIER HANDLE 10FR (MISCELLANEOUS) ×3
SUCTION TUBE FRAZIER 10FR DISP (MISCELLANEOUS) ×1 IMPLANT
SUPPORT WRAP ARM LG (MISCELLANEOUS) ×3 IMPLANT
SUT ETHIBOND 5 LR DA (SUTURE) ×3 IMPLANT
SUT VIC AB 0 CT1 36 (SUTURE) ×6 IMPLANT
SUT VIC AB 2-0 CT1 27 (SUTURE) ×4
SUT VIC AB 2-0 CT1 TAPERPNT 27 (SUTURE) ×2 IMPLANT
SUTURE TAPE 1.3 40 TPR END (SUTURE) ×3 IMPLANT
SUTURETAPE 1.3 40 TPR END (SUTURE) ×9
SYR CONTROL 10ML LL (SYRINGE) ×3 IMPLANT
TOWEL OR 17X26 10 PK STRL BLUE (TOWEL DISPOSABLE) ×6 IMPLANT
TRAY FOLEY MTR SLVR 14FR STAT (SET/KITS/TRAYS/PACK) IMPLANT
WATER STERILE IRR 1000ML POUR (IV SOLUTION) ×3 IMPLANT
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
## 2020-03-17 NOTE — Anesthesia Preprocedure Evaluation (Signed)
Anesthesia Evaluation  Patient identified by MRN, date of birth, ID band Patient awake    Reviewed: Allergy & Precautions, NPO status , Patient's Chart, lab work & pertinent test results  Airway Mallampati: II  TM Distance: >3 FB Neck ROM: Full    Dental  (+) Dental Advisory Given   Pulmonary neg pulmonary ROS,    breath sounds clear to auscultation       Cardiovascular hypertension, Pt. on medications  Rhythm:Regular Rate:Normal     Neuro/Psych negative neurological ROS     GI/Hepatic negative GI ROS, Neg liver ROS,   Endo/Other  negative endocrine ROS  Renal/GU negative Renal ROS     Musculoskeletal   Abdominal   Peds  Hematology negative hematology ROS (+)   Anesthesia Other Findings   Reproductive/Obstetrics                             Anesthesia Physical Anesthesia Plan  ASA: II  Anesthesia Plan: General   Post-op Pain Management:  Regional for Post-op pain   Induction: Intravenous  PONV Risk Score and Plan: 2 and Dexamethasone, Ondansetron and Treatment may vary due to age or medical condition  Airway Management Planned: Oral ETT  Additional Equipment: None  Intra-op Plan:   Post-operative Plan: Extubation in OR  Informed Consent: I have reviewed the patients History and Physical, chart, labs and discussed the procedure including the risks, benefits and alternatives for the proposed anesthesia with the patient or authorized representative who has indicated his/her understanding and acceptance.     Dental advisory given  Plan Discussed with: CRNA  Anesthesia Plan Comments:         Anesthesia Quick Evaluation
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
## 2020-03-17 NOTE — Transfer of Care (Signed)
Immediate Anesthesia Transfer of Care Note  Patient: Derrick Tran  Procedure(s) Performed: OPEN REDUCTION INTERNAL FIXATION (ORIF) proximal HUMERUS FRACTURE (Left Shoulder)  Patient Location: PACU  Anesthesia Type:GA combined with regional for post-op pain  Level of Consciousness: awake, alert , oriented and patient cooperative  Airway & Oxygen Therapy: Patient Spontanous Breathing and Patient connected to face mask oxygen  Post-op Assessment: Report given to RN and Post -op Vital signs reviewed and stable  Post vital signs: Reviewed and stable  Last Vitals:  Vitals Value Taken Time  BP 124/82 03/17/20 1815  Temp 37.1 C 03/17/20 1813  Pulse 98 03/17/20 1816  Resp 17 03/17/20 1816  SpO2 100 % 03/17/20 1816  Vitals shown include unvalidated device data.  Last Pain:  Vitals:   03/17/20 1213  TempSrc: Oral  PainSc: 7          Complications: No apparent anesthesia complications

## 2020-03-17 NOTE — Anesthesia Postprocedure Evaluation (Signed)
Anesthesia Post Note  Patient: MYRICK MCNAIRY  Procedure(s) Performed: OPEN REDUCTION INTERNAL FIXATION (ORIF) proximal HUMERUS FRACTURE (Left Shoulder)     Patient location during evaluation: PACU Anesthesia Type: General Level of consciousness: awake and alert Pain management: pain level controlled Vital Signs Assessment: post-procedure vital signs reviewed and stable Respiratory status: spontaneous breathing, nonlabored ventilation, respiratory function stable and patient connected to nasal cannula oxygen Cardiovascular status: blood pressure returned to baseline and stable Postop Assessment: no apparent nausea or vomiting Anesthetic complications: no    Last Vitals:  Vitals:   03/17/20 1915 03/17/20 1928  BP: (!) 141/93 (!) 134/98  Pulse:  (!) 119  Resp:  16  Temp: 37 C 37.1 C  SpO2: 94% 99%    Last Pain:  Vitals:   03/17/20 1928  TempSrc: Oral  PainSc:                  Tiajuana Amass

## 2020-03-17 NOTE — Anesthesia Procedure Notes (Signed)
Anesthesia Regional Block: Interscalene brachial plexus block   Pre-Anesthetic Checklist: ,, timeout performed, Correct Patient, Correct Site, Correct Laterality, Correct Procedure, Correct Position, site marked, Risks and benefits discussed,  Surgical consent,  Pre-op evaluation,  At surgeon's request and post-op pain management  Laterality: Left  Prep: chloraprep       Needles:  Injection technique: Single-shot  Needle Type: Echogenic Stimulator Needle     Needle Length: 9cm  Needle Gauge: 21     Additional Needles:   Procedures:, nerve stimulator,,, ultrasound used (permanent image in chart),,,,   Nerve Stimulator or Paresthesia:  Response: Deltoid and bicep, 0.5 mA,   Additional Responses:   Narrative:  Start time: 03/17/2020 4:06 PM End time: 03/17/2020 4:13 PM Injection made incrementally with aspirations every 5 mL.  Performed by: Personally  Anesthesiologist: Suzette Battiest, MD

## 2020-03-17 NOTE — Progress Notes (Signed)
Called to wife, updated room number and visiting hrs.
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
## 2020-03-17 NOTE — Anesthesia Procedure Notes (Signed)
Procedure Name: Intubation Date/Time: 03/17/2020 4:23 PM Performed by: Lollie Sails, CRNA Pre-anesthesia Checklist: Patient identified, Emergency Drugs available, Suction available, Patient being monitored and Timeout performed Patient Re-evaluated:Patient Re-evaluated prior to induction Oxygen Delivery Method: Circle system utilized Preoxygenation: Pre-oxygenation with 100% oxygen Induction Type: IV induction Ventilation: Mask ventilation without difficulty Laryngoscope Size: Miller and 3 Grade View: Grade II Tube type: Oral Tube size: 7.5 mm Number of attempts: 1 Airway Equipment and Method: Stylet Placement Confirmation: ETT inserted through vocal cords under direct vision,  positive ETCO2 and breath sounds checked- equal and bilateral Secured at: 23 cm Tube secured with: Tape Dental Injury: Teeth and Oropharynx as per pre-operative assessment

## 2020-03-18 ENCOUNTER — Encounter (HOSPITAL_COMMUNITY): Payer: Self-pay | Admitting: Orthopedic Surgery

## 2020-03-18 LAB — CBC
HCT: 31.6 % — ABNORMAL LOW (ref 39.0–52.0)
Hemoglobin: 10.8 g/dL — ABNORMAL LOW (ref 13.0–17.0)
MCH: 34 pg (ref 26.0–34.0)
MCHC: 34.2 g/dL (ref 30.0–36.0)
MCV: 99.4 fL (ref 80.0–100.0)
Platelets: 229 10*3/uL (ref 150–400)
RBC: 3.18 MIL/uL — ABNORMAL LOW (ref 4.22–5.81)
RDW: 11.9 % (ref 11.5–15.5)
WBC: 10.6 10*3/uL — ABNORMAL HIGH (ref 4.0–10.5)
nRBC: 0 % (ref 0.0–0.2)

## 2020-03-18 MED ORDER — TIZANIDINE HCL 4 MG PO TABS
4.0000 mg | ORAL_TABLET | Freq: Three times a day (TID) | ORAL | 1 refills | Status: DC | PRN
Start: 2020-03-18 — End: 2022-11-27

## 2020-03-18 MED ORDER — OXYCODONE-ACETAMINOPHEN 5-325 MG PO TABS: ORAL_TABLET | ORAL | 0 refills | Status: DC
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
## 2020-03-18 NOTE — Plan of Care (Signed)
  Problem: Education: Goal: Knowledge of the prescribed therapeutic regimen will improve Outcome: Progressing Goal: Understanding of activity limitations/precautions following surgery will improve Outcome: Progressing   Problem: Activity: Goal: Ability to tolerate increased activity will improve Outcome: Progressing   Problem: Pain Management: Goal: Pain level will decrease with appropriate interventions Outcome: Progressing   

## 2020-03-18 NOTE — Progress Notes (Signed)
Reviewed AVS with patient and family.  Answered all questions.  Patient alert and oriented, skin warm and dry.  Taken via wheelchair to main lobby and turned over to wife and daughter without concerns or complaints voiced.

## 2020-03-18 NOTE — Discharge Instructions (Signed)
Discharge Instructions after Open Shoulder Repair  A sling has been provided for you. Remain in your sling at all times. This includes sleeping in your sling.  Use ice on the shoulder intermittently over the first 48 hours after surgery.  Pain medicine has been prescribed for you.  Use your medicine liberally over the first 48 hours, and then you can begin to taper your use. You may take Extra Strength Tylenol or Tylenol only in place of the pain pills. DO NOT take ANY nonsteroidal anti-inflammatory pain medications: Advil, Motrin, Ibuprofen, Aleve, Naproxen or Naprosyn.  Dressing is waterproof, okay to shower and leave on until first post op visit w Dr. Tamera Punt Take one aspirin, a day for 2 weeks after surgery, unless you have an aspirin sensitivity/ allergy or asthma.   Please call [phone] during normal business hours or [phone] after hours for any problems. Including the following:  - excessive redness of the incisions - drainage for more than 4 days - fever of more than 101.5 F  *Please note that pain medications will not be refilled after hours or on weekends.

## 2020-03-18 NOTE — Progress Notes (Signed)
   PATIENT ID: Derrick Tran   1 Day Post-Op Procedure(s) (LRB): OPEN REDUCTION INTERNAL FIXATION (ORIF) proximal HUMERUS FRACTURE (Left)  Subjective: Denies pain, arm still numb. No complaints or concerns.   Objective:  Vitals:   03/18/20 0139 03/18/20 0524  BP: (!) 139/93 133/86  Pulse: (!) 105 99  Resp: 18 20  Temp: 98 F (36.7 C) (!) 97.5 F (36.4 C)  SpO2: 96% 98%     L shoulder dressing c/d/i Cannot wiggle fingers, numb distally, block still working  Labs:  Recent Labs    03/17/20 1342 03/18/20 0410  HGB 15.2 10.8*   Recent Labs    03/17/20 1342 03/18/20 0410  WBC 22.9* 10.6*  RBC 4.44 3.18*  HCT 44.4 31.6*  PLT 320 229   Recent Labs    03/17/20 1342  NA 136  K 3.7  CL 100  CO2 24  BUN 13  CREATININE 0.76  GLUCOSE 118*  CALCIUM 8.9    Assessment and Plan: 1 day s/p L ORIF for left shoulder fracture/dislocation Sling OT came yesterday  D/c home today Fu with dr. Tamera Punt in about 10 days.   VTE proph: asa, scds
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
## 2020-03-19 ENCOUNTER — Encounter: Payer: Self-pay | Admitting: *Deleted

## 2020-05-09 ENCOUNTER — Other Ambulatory Visit (HOSPITAL_COMMUNITY): Payer: Self-pay | Admitting: Orthopedic Surgery

## 2020-05-09 DIAGNOSIS — M7989 Other specified soft tissue disorders: Secondary | ICD-10-CM

## 2020-05-10 ENCOUNTER — Other Ambulatory Visit: Payer: Self-pay

## 2020-05-10 ENCOUNTER — Ambulatory Visit (HOSPITAL_COMMUNITY)
Admission: RE | Admit: 2020-05-10 | Discharge: 2020-05-10 | Disposition: A | Discharge status: Home / Self Care | Payer: Managed Care, Other (non HMO) | Source: Ambulatory Visit | Attending: Orthopedic Surgery | Admitting: Orthopedic Surgery

## 2020-05-10 DIAGNOSIS — M7989 Other specified soft tissue disorders: Secondary | ICD-10-CM | POA: Diagnosis present

## 2020-05-10 DIAGNOSIS — M79602 Pain in left arm: Secondary | ICD-10-CM | POA: Diagnosis not present
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
## 2021-06-09 IMAGING — CR DG HIP (WITH OR WITHOUT PELVIS) 2-3V*L*
3 series · 3 of 3 positions shown · non-contrast
Comparison: None.

CLINICAL DATA: Fall, pain

EXAM:
DG HIP (WITH OR WITHOUT PELVIS) 2-3V LEFT

[t pelvis ap]
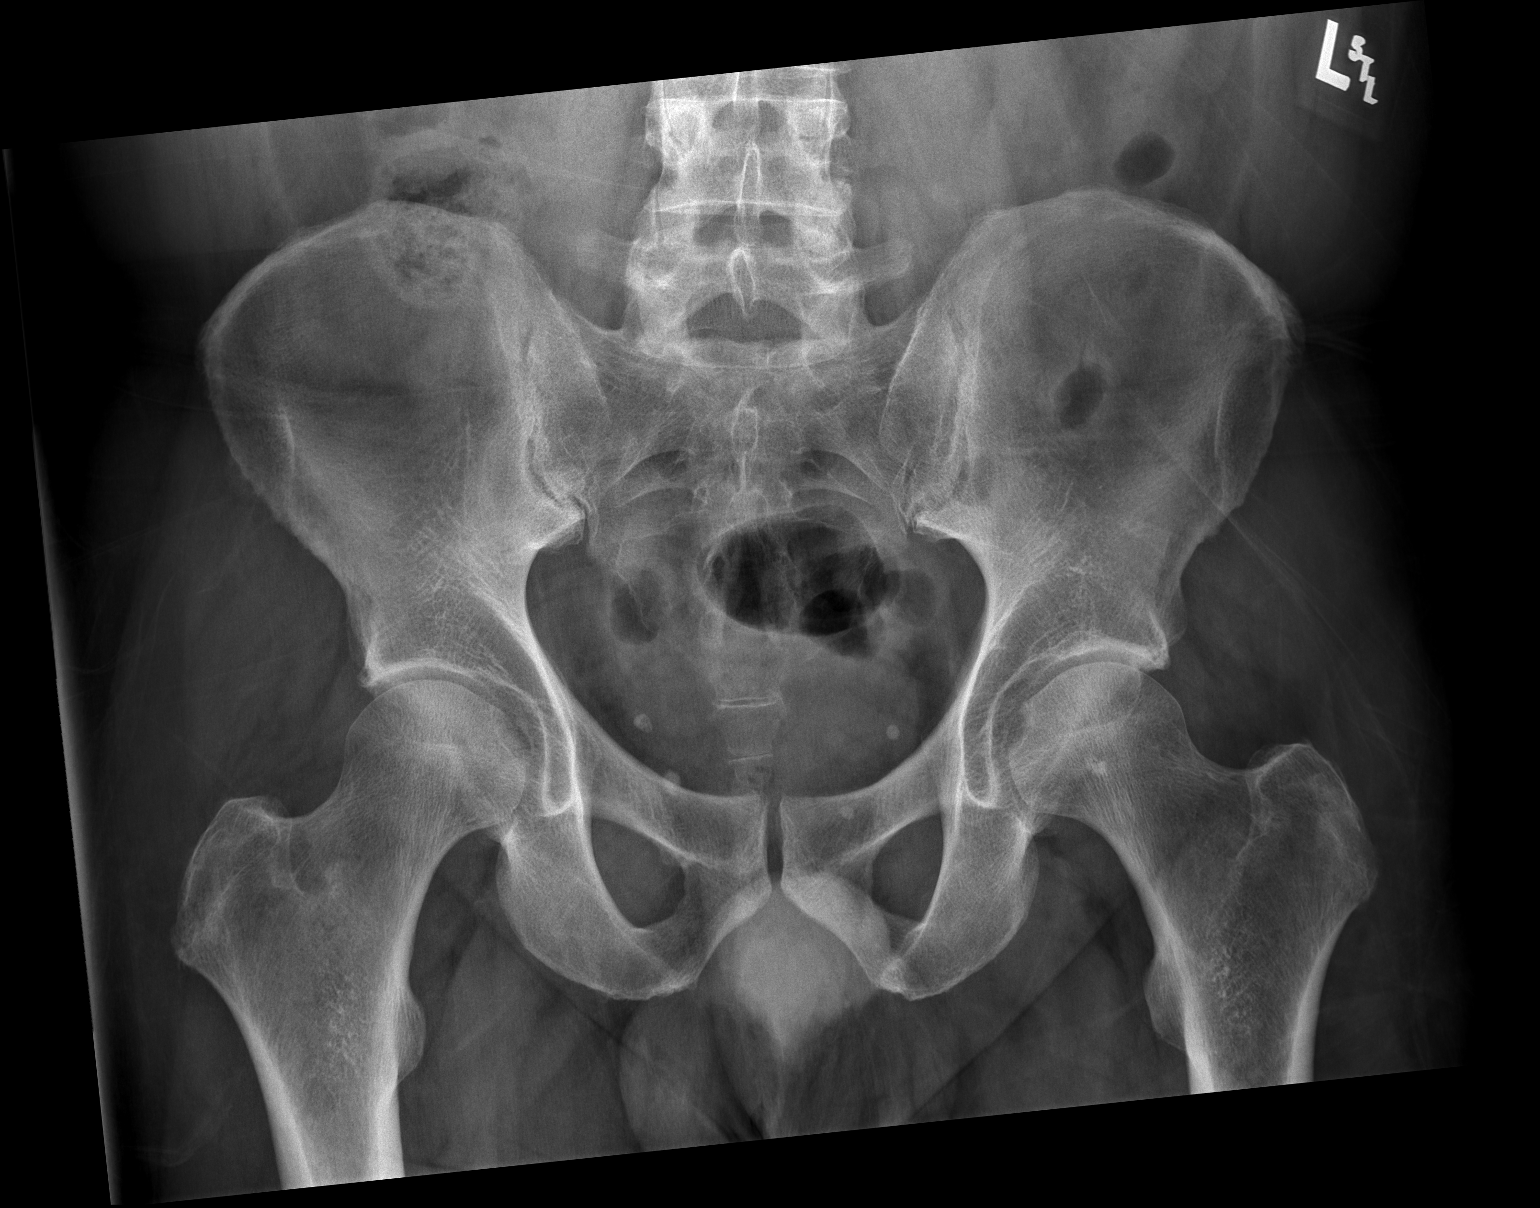

[t hip ap left]
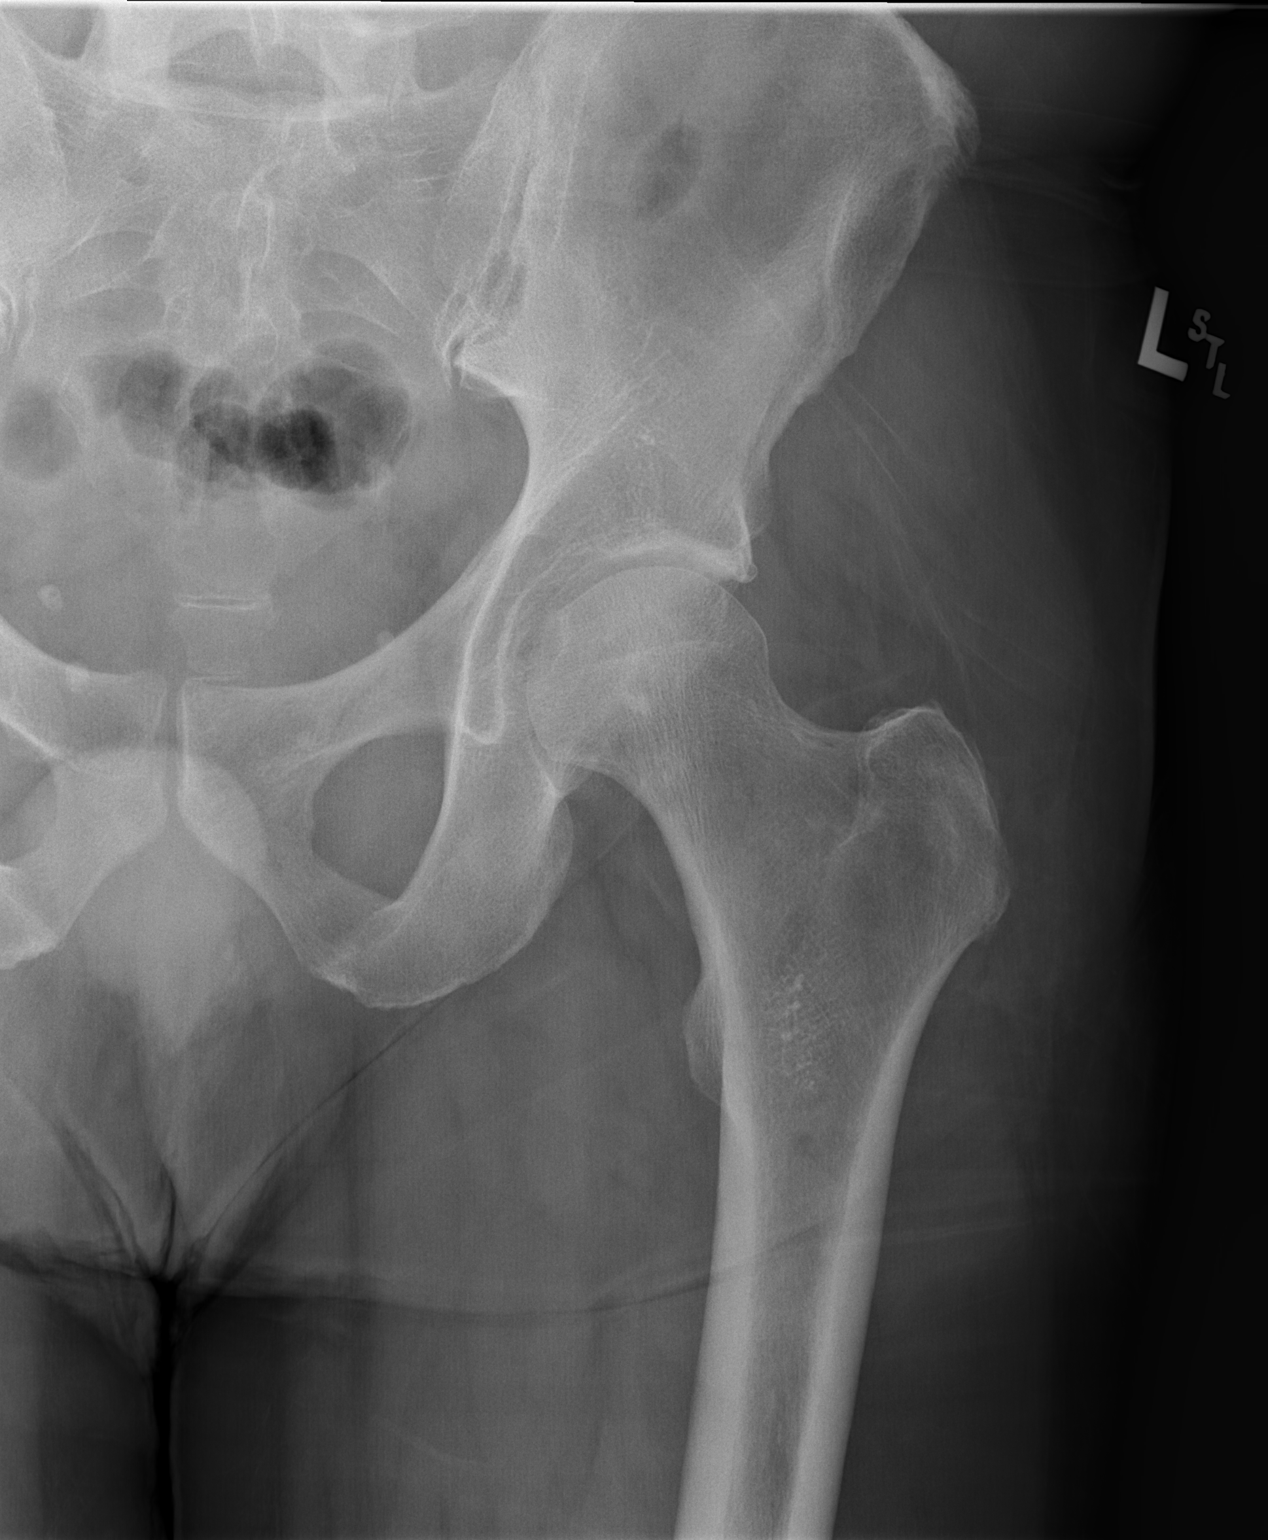

[t hip frog leg left]
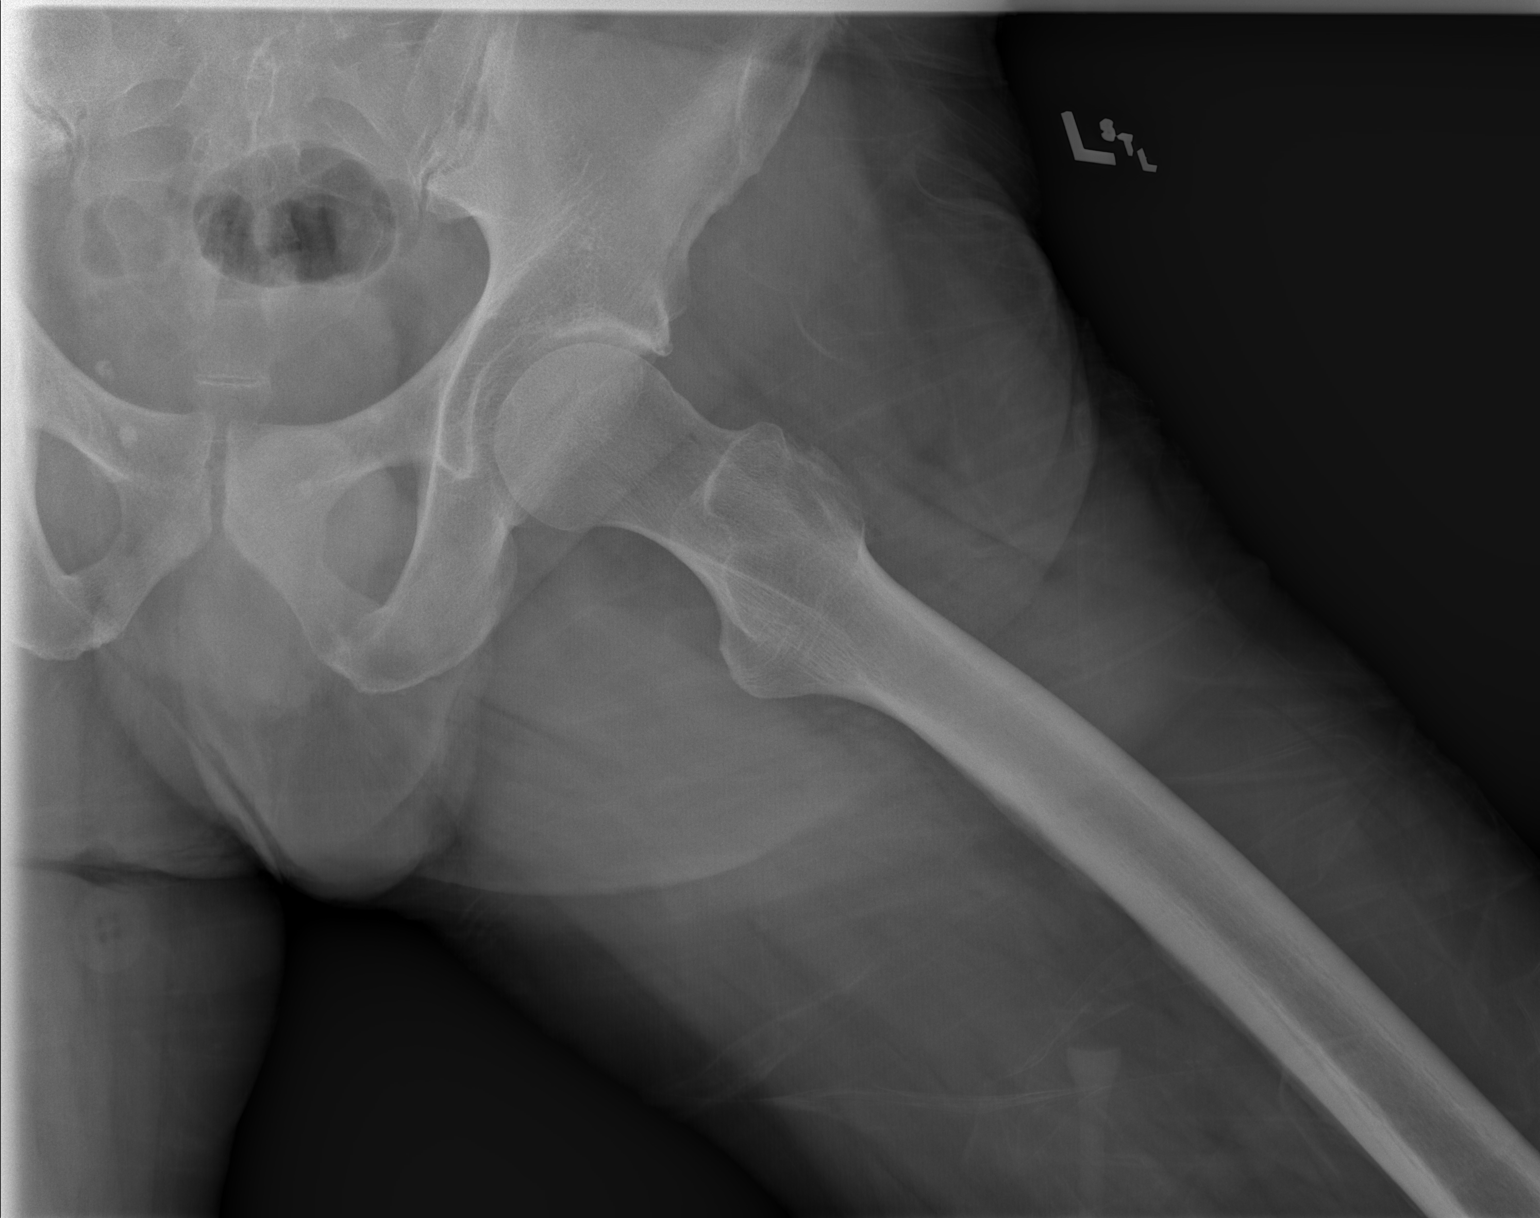

[3 of 3 positions shown; findings below may reference images not displayed]

FINDINGS: There is no evidence of hip fracture or dislocation. Mild acetabular
arthrosis. Nonobstructive pattern of overlying bowel gas.
IMPRESSION: No displaced fracture or dislocation of the left hip or pelvis.

## 2022-10-31 ENCOUNTER — Other Ambulatory Visit: Payer: Self-pay | Admitting: Orthopedic Surgery

## 2022-11-25 NOTE — Patient Instructions (Addendum)
DUE TO COVID-19 ONLY TWO VISITORS  (aged 62 and older)  ARE ALLOWED TO COME WITH YOU AND STAY IN THE WAITING ROOM ONLY DURING PRE OP AND PROCEDURE.   **NO VISITORS ARE ALLOWED IN THE SHORT STAY AREA OR RECOVERY ROOM!!**  IF YOU WILL BE ADMITTED INTO THE HOSPITAL YOU ARE ALLOWED ONLY FOUR SUPPORT PEOPLE DURING VISITATION HOURS ONLY (7 AM -8PM)   The support person(s) must pass our screening, gel in and out, and wear a mask at all times, including in the patient's room. Patients must also wear a mask when staff or their support person are in the room. Visitors GUEST BADGE MUST BE WORN VISIBLY  One adult visitor may remain with you overnight and MUST be in the room by 8 P.M.     Your procedure is scheduled on: Thursday, Feb. 22, 2024   Report to [facility] Main Entrance    Report to admitting at : 5:15 AM   Call this number if you have problems the morning of surgery [phone]   Do not eat food :After Midnight.   After Midnight you may have the following liquids until : 4:30 AM DAY OF SURGERY  Water Black Coffee (sugar ok, NO MILK/CREAM OR CREAMERS)  Tea (sugar ok, NO MILK/CREAM OR CREAMERS) regular and decaf                             Plain Jell-O (NO RED)                                           Fruit ices (not with fruit pulp, NO RED)                                     Popsicles (NO RED)                                                                  Juice: apple, WHITE grape, WHITE cranberry Sports drinks like Gatorade (NO RED)   The day of surgery:  Drink ONE (1) Pre-Surgery Clear Ensure  at: 4:30  AM the morning of surgery. Drink in one sitting. Do not sip.  This drink was given to you during your hospital  pre-op appointment visit. Nothing else to drink after completing the  Pre-Surgery Clear Ensure.          If you have questions, please contact your surgeon's office.    Oral Hygiene is also important to reduce your risk of infection.                                     Remember - BRUSH YOUR TEETH THE MORNING OF SURGERY WITH YOUR REGULAR TOOTHPASTE  DENTURES WILL BE REMOVED PRIOR TO SURGERY PLEASE DO NOT APPLY "Poly grip" OR ADHESIVES!!!   Do NOT smoke after Midnight   Take these medicines the morning of surgery with A SIP OF WATER: finasteride   Bring Asthma  Inhaler day of surgery   Do not take Tadalafil 24 hours prior to surgery                              You may not have any metal on your body including  jewelry, and body piercing             Do not wear lotions, powders, perfumes/cologne, or deodorant              Men may shave face and neck.   Do not bring valuables to the hospital. [place].   Contacts, glasses, or bridgework may not be worn into surgery.    DO NOT [place]. PHARMACY WILL DISPENSE MEDICATIONS LISTED ON YOUR MEDICATION LIST TO YOU DURING YOUR ADMISSION [place]!    Patients discharged on the day of surgery will not be allowed to drive home.  Someone NEEDS to stay with you for the first 24 hours after anesthesia.   Special Instructions: Bring a copy of your healthcare power of attorney and living will documents         the day of surgery if you haven't scanned them before.              Please read over the following fact sheets you were given: IF YOU HAVE QUESTIONS ABOUT YOUR PRE-OP INSTRUCTIONS PLEASE CALL [phone]    [facility] - Preparing for Surgery Before surgery, you can play an important role.  Because skin is not sterile, your skin needs to be as free of germs as possible.  You can reduce the number of germs on your skin by washing with CHG (chlorahexidine gluconate) soap before surgery.  CHG is an antiseptic cleaner which kills germs and bonds with the skin to continue killing germs even after washing. Please DO NOT use if you have an allergy to CHG or antibacterial soaps.  If your skin becomes  reddened/irritated stop using the CHG and inform your nurse when you arrive at Short Stay. Do not shave (including legs and underarms) for at least 48 hours prior to the first CHG shower.  You may shave your face/neck. Please follow these instructions carefully:  1.  Shower with CHG Soap the night before surgery and the  morning of Surgery.  2.  If you choose to wash your hair, wash your hair first as usual with your  normal  shampoo.  3.  After you shampoo, rinse your hair and body thoroughly to remove the  shampoo.                           4.  Use CHG as you would any other liquid soap.  You can apply chg directly  to the skin and wash                       Gently with a scrungie or clean washcloth.  5.  Apply the CHG Soap to your body ONLY FROM THE NECK DOWN.   Do not use on face/ open                           Wound or open sores. Avoid contact with eyes, ears  mouth and genitals (private parts).                       Wash face,  Genitals (private parts) with your normal soap.             6.  Wash thoroughly, paying special attention to the area where your surgery  will be performed.  7.  Thoroughly rinse your body with warm water from the neck down.  8.  DO NOT shower/wash with your normal soap after using and rinsing off  the CHG Soap.                9.  Pat yourself dry with a clean towel.            10.  Wear clean pajamas.            11.  Place clean sheets on your bed the night of your first shower and do not  sleep with pets. Day of Surgery : Do not apply any lotions/deodorants the morning of surgery.  Please wear clean clothes to the hospital/surgery center.  FAILURE TO FOLLOW THESE INSTRUCTIONS MAY RESULT IN THE CANCELLATION OF YOUR SURGERY PATIENT SIGNATURE_________________________________  NURSE SIGNATURE__________________________________  ________________________________________________________________________  Adam Phenix  An incentive spirometer is a tool that  can help keep your lungs clear and active. This tool measures how well you are filling your lungs with each breath. Taking long deep breaths may help reverse or decrease the chance of developing breathing (pulmonary) problems (especially infection) following: A long period of time when you are unable to move or be active. BEFORE THE PROCEDURE  If the spirometer includes an indicator to show your best effort, your nurse or respiratory therapist will set it to a desired goal. If possible, sit up straight or lean slightly forward. Try not to slouch. Hold the incentive spirometer in an upright position. INSTRUCTIONS FOR USE  Sit on the edge of your bed if possible, or sit up as far as you can in bed or on a chair. Hold the incentive spirometer in an upright position. Breathe out normally. Place the mouthpiece in your mouth and seal your lips tightly around it. Breathe in slowly and as deeply as possible, raising the piston or the ball toward the top of the column. Hold your breath for 3-5 seconds or for as long as possible. Allow the piston or ball to fall to the bottom of the column. Remove the mouthpiece from your mouth and breathe out normally. Rest for a few seconds and repeat Steps 1 through 7 at least 10 times every 1-2 hours when you are awake. Take your time and take a few normal breaths between deep breaths. The spirometer may include an indicator to show your best effort. Use the indicator as a goal to work toward during each repetition. After each set of 10 deep breaths, practice coughing to be sure your lungs are clear. If you have an incision (the cut made at the time of surgery), support your incision when coughing by placing a pillow or rolled up towels firmly against it. Once you are able to get out of bed, walk around indoors and cough well. You may stop using the incentive spirometer when instructed by your caregiver.  RISKS AND COMPLICATIONS Take your time so you do not get dizzy or  light-headed. If you are in pain, you may need to take or ask for pain medication before doing incentive spirometry. It is  harder to take a deep breath if you are having pain. AFTER USE Rest and breathe slowly and easily. It can be helpful to keep track of a log of your progress. Your caregiver can provide you with a simple table to help with this. If you are using the spirometer at home, follow these instructions: [place] IF:  You are having difficultly using the spirometer. You have trouble using the spirometer as often as instructed. Your pain medication is not giving enough relief while using the spirometer. You develop fever of 100.5 F (38.1 C) or higher. SEEK IMMEDIATE MEDICAL CARE IF:  You cough up bloody sputum that had not been present before. You develop fever of 102 F (38.9 C) or greater. You develop worsening pain at or near the incision site. MAKE SURE YOU:  Understand these instructions. Will watch your condition. Will get help right away if you are not doing well or get worse. Document Released: 02/09/2007 Document Revised: 12/22/2011 Document Reviewed: 04/12/2007 [facility] Patient Information 2014 [place], [place].   ________________________________________________________________________

## 2022-11-28 ENCOUNTER — Encounter (HOSPITAL_COMMUNITY)
Admission: RE | Admit: 2022-11-28 | Discharge: 2022-11-28 | Disposition: A | Discharge status: Home / Self Care | Payer: Managed Care, Other (non HMO) | Source: Ambulatory Visit | Attending: Orthopedic Surgery | Admitting: Orthopedic Surgery

## 2022-11-28 ENCOUNTER — Encounter (HOSPITAL_COMMUNITY): Payer: Self-pay

## 2022-11-28 ENCOUNTER — Other Ambulatory Visit: Payer: Self-pay

## 2022-11-28 ENCOUNTER — Ambulatory Visit (HOSPITAL_COMMUNITY)
Admission: RE | Admit: 2022-11-28 | Discharge: 2022-11-28 | Disposition: A | Discharge status: Home / Self Care | Payer: Managed Care, Other (non HMO) | Source: Ambulatory Visit | Attending: Orthopedic Surgery

## 2022-11-28 VITALS — BP 112/79 | HR 81 | Temp 98.2°F | Resp 16 | Ht 68.0 in | Wt 174.0 lb

## 2022-11-28 DIAGNOSIS — I2510 Atherosclerotic heart disease of native coronary artery without angina pectoris: Secondary | ICD-10-CM | POA: Insufficient documentation

## 2022-11-28 DIAGNOSIS — Z01818 Encounter for other preprocedural examination: Secondary | ICD-10-CM | POA: Insufficient documentation

## 2022-11-28 HISTORY — DX: Unspecified osteoarthritis, unspecified site: M19.90

## 2022-11-28 LAB — BASIC METABOLIC PANEL
Anion gap: 10 (ref 5–15)
BUN: 10 mg/dL (ref 8–23)
CO2: 22 mmol/L (ref 22–32)
Calcium: 9.3 mg/dL (ref 8.9–10.3)
Chloride: 104 mmol/L (ref 98–111)
Creatinine, Ser: 0.76 mg/dL (ref 0.61–1.24)
GFR, Estimated: >60 mL/min (ref >60)
Glucose, Bld: 90 mg/dL (ref 70–99)
Potassium: 4.3 mmol/L (ref 3.5–5.1)
Sodium: 136 mmol/L (ref 135–145)

## 2022-11-28 LAB — CBC
HCT: 44.3 % (ref 39.0–52.0)
Hemoglobin: 15.1 g/dL (ref 13.0–17.0)
MCH: 34.7 pg — ABNORMAL HIGH (ref 26.0–34.0)
MCHC: 34.1 g/dL (ref 30.0–36.0)
MCV: 101.8 fL — ABNORMAL HIGH (ref 80.0–100.0)
Platelets: 335 10*3/uL (ref 150–400)
RBC: 4.35 MIL/uL (ref 4.22–5.81)
RDW: 11.8 % (ref 11.5–15.5)
WBC: 8 10*3/uL (ref 4.0–10.5)
nRBC: 0 % (ref 0.0–0.2)

## 2022-11-28 LAB — SURGICAL PCR SCREEN
MRSA, PCR: NEGATIVE
Staphylococcus aureus: POSITIVE — AB

## 2022-11-28 NOTE — Progress Notes (Signed)
PCR results faxed to Dr. Dierdre Highman office via epic.

## 2022-11-28 NOTE — Progress Notes (Addendum)
For Anesthesia: PCP - Salvatore Marvel, PA-C, Randie Heinz, FNP  pre op clearance note 10/22/22 in [facility] Cardiologist - N/A  Chest x-ray - 11/28/22 in [facility] EKG - 1/10//24 in [facility] Stress Test - N/A ECHO - N/A Cardiac Cath - N/A Pacemaker/ICD device last checked: N/A Pacemaker orders received: N/A Device Rep notified: N/A  Spinal Cord Stimulator: N/A  Sleep Study - N/A CPAP - N/A  Fasting Blood Sugar - N/A Checks Blood Sugar __N/A___ times a day Date and result of last Hgb A1c-N/A  Last dose of GLP1 agonist- N/A GLP1 instructions: N/A  Last dose of SGLT-2 inhibitors- N/A SGLT-2 instructions: N/A  Blood Thinner Instructions:N/A Aspirin Instructions: Yes Last Dose: Instructed to stop 4-5 days prior to surgery  Activity level: Can go up a flight of stairs and activities of daily living without stopping and without chest pain and/or shortness of breath       Anesthesia review: N/A  Patient denies shortness of breath, fever, cough and chest pain at PAT appointment   Patient verbalized understanding of instructions that were given to them at the PAT appointment. Patient was also instructed that they will need to review over the PAT instructions again at home before surgery.

## 2022-12-03 MED ORDER — TRANEXAMIC ACID 1000 MG/10ML IV SOLN
2000.0000 mg | INTRAVENOUS | Status: DC
  Filled 2022-12-03: qty 20

## 2022-12-03 NOTE — Anesthesia Preprocedure Evaluation (Addendum)
Anesthesia Evaluation  Patient identified by MRN, date of birth, ID band Patient awake    Reviewed: Allergy & Precautions, NPO status , Patient's Chart, lab work & pertinent test results  Airway Mallampati: II  TM Distance: >3 FB Neck ROM: Full    Dental no notable dental hx.    Pulmonary neg pulmonary ROS   Pulmonary exam normal breath sounds clear to auscultation       Cardiovascular hypertension, Pt. on medications Normal cardiovascular exam Rhythm:Regular Rate:Normal     Neuro/Psych negative neurological ROS     GI/Hepatic negative GI ROS, Neg liver ROS,,,  Endo/Other  negative endocrine ROS    Renal/GU negative Renal ROS     Musculoskeletal  (+) Arthritis ,    Abdominal   Peds  Hematology negative hematology ROS (+)   Anesthesia Other Findings   Reproductive/Obstetrics                             Anesthesia Physical Anesthesia Plan  ASA: 2  Anesthesia Plan: Spinal   Post-op Pain Management:  [facility] for Post-op pain and Tylenol PO (pre-op)* and Regional block*   Induction: Intravenous  PONV Risk Score and Plan: 2 and Ondansetron, Dexamethasone, Midazolam and Treatment may vary due to age or medical condition  Airway Management Planned:   Additional Equipment: None  Intra-op Plan:   Post-operative Plan:   Informed Consent: I have reviewed the patients History and Physical, chart, labs and discussed the procedure including the risks, benefits and alternatives for the proposed anesthesia with the patient or authorized representative who has indicated his/her understanding and acceptance.     Dental advisory given  Plan Discussed with: CRNA  Anesthesia Plan Comments:         Anesthesia Quick Evaluation

## 2022-12-04 ENCOUNTER — Ambulatory Visit (HOSPITAL_COMMUNITY): Payer: Managed Care, Other (non HMO) | Admitting: Physician Assistant

## 2022-12-04 ENCOUNTER — Encounter (HOSPITAL_COMMUNITY)
Admission: RE | Disposition: A | Discharge status: Home / Self Care | Payer: Self-pay | Source: Home / Self Care | Attending: Orthopedic Surgery

## 2022-12-04 ENCOUNTER — Ambulatory Visit (HOSPITAL_BASED_OUTPATIENT_CLINIC_OR_DEPARTMENT_OTHER): Payer: Managed Care, Other (non HMO) | Admitting: Anesthesiology

## 2022-12-04 ENCOUNTER — Encounter (HOSPITAL_COMMUNITY): Payer: Self-pay | Admitting: Orthopedic Surgery

## 2022-12-04 ENCOUNTER — Other Ambulatory Visit (HOSPITAL_COMMUNITY): Payer: Self-pay

## 2022-12-04 ENCOUNTER — Other Ambulatory Visit: Payer: Self-pay

## 2022-12-04 ENCOUNTER — Ambulatory Visit (HOSPITAL_COMMUNITY)
Admission: RE | Admit: 2022-12-04 | Discharge: 2022-12-04 | Disposition: A | Discharge status: Home / Self Care | Payer: Managed Care, Other (non HMO) | Attending: Orthopedic Surgery | Admitting: Orthopedic Surgery

## 2022-12-04 DIAGNOSIS — I10 Essential (primary) hypertension: Secondary | ICD-10-CM | POA: Insufficient documentation

## 2022-12-04 DIAGNOSIS — Z79899 Other long term (current) drug therapy: Secondary | ICD-10-CM | POA: Insufficient documentation

## 2022-12-04 DIAGNOSIS — M1711 Unilateral primary osteoarthritis, right knee: Secondary | ICD-10-CM

## 2022-12-04 DIAGNOSIS — M170 Bilateral primary osteoarthritis of knee: Secondary | ICD-10-CM | POA: Insufficient documentation

## 2022-12-04 DIAGNOSIS — Z96651 Presence of right artificial knee joint: Secondary | ICD-10-CM

## 2022-12-04 DIAGNOSIS — E785 Hyperlipidemia, unspecified: Secondary | ICD-10-CM | POA: Diagnosis not present

## 2022-12-04 HISTORY — PX: TOTAL KNEE ARTHROPLASTY: SHX125

## 2022-12-04 SURGERY — ARTHROPLASTY, KNEE, TOTAL
Anesthesia: Spinal | Site: Knee | Laterality: Right

## 2022-12-04 MED ORDER — BUPIVACAINE LIPOSOME 1.3 % IJ SUSP
INTRAMUSCULAR | Status: DC | PRN
  Administered 2022-12-04: 20 mL

## 2022-12-04 MED ORDER — PHENYLEPHRINE HCL (PRESSORS) 10 MG/ML IV SOLN
INTRAVENOUS | Status: AC
  Filled 2022-12-04: qty 1

## 2022-12-04 MED ORDER — LIDOCAINE HCL (PF) 2 % IJ SOLN
INTRAMUSCULAR | Status: DC | PRN
  Administered 2022-12-04: 3 mL via INTRADERMAL

## 2022-12-04 MED ORDER — AMISULPRIDE (ANTIEMETIC) 5 MG/2ML IV SOLN: 10.0000 mg | Freq: Once | INTRAVENOUS | Status: DC | PRN

## 2022-12-04 MED ORDER — DEXAMETHASONE SODIUM PHOSPHATE 4 MG/ML IJ SOLN
INTRAMUSCULAR | Status: DC | PRN
  Administered 2022-12-04: 5 mg via PERINEURAL

## 2022-12-04 MED ORDER — LIDOCAINE HCL (PF) 2 % IJ SOLN
INTRAMUSCULAR | Status: AC
  Filled 2022-12-04: qty 5

## 2022-12-04 MED ORDER — CHLORHEXIDINE GLUCONATE 0.12 % MT SOLN
15.0000 mL | Freq: Once | OROMUCOSAL | Status: AC
  Administered 2022-12-04: 15 mL via OROMUCOSAL

## 2022-12-04 MED ORDER — ACETAMINOPHEN 500 MG PO TABS
1000.0000 mg | ORAL_TABLET | Freq: Once | ORAL | Status: AC
  Administered 2022-12-04: 1000 mg via ORAL
  Filled 2022-12-04: qty 2

## 2022-12-04 MED ORDER — SODIUM CHLORIDE 0.9 % IR SOLN
Status: DC | PRN
  Administered 2022-12-04 (×2): 1000 mL

## 2022-12-04 MED ORDER — ORAL CARE MOUTH RINSE: 15.0000 mL | Freq: Once | OROMUCOSAL | Status: AC

## 2022-12-04 MED ORDER — PROPOFOL 500 MG/50ML IV EMUL
INTRAVENOUS | Status: AC
  Filled 2022-12-04: qty 50

## 2022-12-04 MED ORDER — BUPIVACAINE LIPOSOME 1.3 % IJ SUSP
INTRAMUSCULAR | Status: AC
  Filled 2022-12-04: qty 20

## 2022-12-04 MED ORDER — PROPOFOL 10 MG/ML IV BOLUS
INTRAVENOUS | Status: DC | PRN
  Administered 2022-12-04: 40 mg via INTRAVENOUS

## 2022-12-04 MED ORDER — LACTATED RINGERS IV SOLN: INTRAVENOUS | Status: DC

## 2022-12-04 MED ORDER — ONDANSETRON HCL 4 MG/2ML IJ SOLN
INTRAMUSCULAR | Status: DC | PRN
  Administered 2022-12-04: 4 mg via INTRAVENOUS

## 2022-12-04 MED ORDER — FENTANYL CITRATE (PF) 100 MCG/2ML IJ SOLN
INTRAMUSCULAR | Status: DC | PRN
  Administered 2022-12-04: 50 ug via INTRAVENOUS

## 2022-12-04 MED ORDER — CEFAZOLIN SODIUM-DEXTROSE 2-4 GM/100ML-% IV SOLN
2.0000 g | INTRAVENOUS | Status: AC
  Administered 2022-12-04: 2 g via INTRAVENOUS
  Filled 2022-12-04: qty 100

## 2022-12-04 MED ORDER — PROMETHAZINE HCL 12.5 MG PO TABS
12.5000 mg | ORAL_TABLET | Freq: Four times a day (QID) | ORAL | 0 refills | Status: AC | PRN
  Filled 2022-12-04: qty 30, 8d supply, fill #0

## 2022-12-04 MED ORDER — SODIUM CHLORIDE (PF) 0.9 % IJ SOLN
INTRAMUSCULAR | Status: AC
  Filled 2022-12-04: qty 50

## 2022-12-04 MED ORDER — MIDAZOLAM HCL 2 MG/2ML IJ SOLN
INTRAMUSCULAR | Status: DC | PRN
  Administered 2022-12-04: 2 mg via INTRAVENOUS

## 2022-12-04 MED ORDER — OXYCODONE HCL 5 MG PO TABS
5.0000 mg | ORAL_TABLET | Freq: Four times a day (QID) | ORAL | 0 refills | Status: AC | PRN
  Filled 2022-12-04: qty 40, 10d supply, fill #0

## 2022-12-04 MED ORDER — ONDANSETRON HCL 4 MG/2ML IJ SOLN
INTRAMUSCULAR | Status: AC
  Filled 2022-12-04: qty 2

## 2022-12-04 MED ORDER — OXYCODONE HCL 5 MG PO TABS
10.0000 mg | ORAL_TABLET | Freq: Once | ORAL | Status: AC
  Administered 2022-12-04: 10 mg via ORAL

## 2022-12-04 MED ORDER — MEPERIDINE HCL 50 MG/ML IJ SOLN: 6.2500 mg | INTRAMUSCULAR | Status: DC | PRN

## 2022-12-04 MED ORDER — DEXAMETHASONE SODIUM PHOSPHATE 10 MG/ML IJ SOLN
INTRAMUSCULAR | Status: AC
  Filled 2022-12-04: qty 1

## 2022-12-04 MED ORDER — OXYCODONE HCL 5 MG PO TABS
ORAL_TABLET | ORAL | Status: AC
  Filled 2022-12-04: qty 2

## 2022-12-04 MED ORDER — PROMETHAZINE HCL 25 MG/ML IJ SOLN: 6.2500 mg | INTRAMUSCULAR | Status: DC | PRN

## 2022-12-04 MED ORDER — DEXAMETHASONE SODIUM PHOSPHATE 10 MG/ML IJ SOLN
INTRAMUSCULAR | Status: DC | PRN
  Administered 2022-12-04: 10 mg via INTRAVENOUS

## 2022-12-04 MED ORDER — SODIUM CHLORIDE (PF) 0.9 % IJ SOLN
INTRAMUSCULAR | Status: AC
  Filled 2022-12-04: qty 10

## 2022-12-04 MED ORDER — POVIDONE-IODINE 10 % EX SWAB: 2.0000 | Freq: Once | CUTANEOUS | Status: DC

## 2022-12-04 MED ORDER — ROPIVACAINE HCL 5 MG/ML IJ SOLN
INTRAMUSCULAR | Status: DC | PRN
  Administered 2022-12-04: 30 mL via PERINEURAL

## 2022-12-04 MED ORDER — 0.9 % SODIUM CHLORIDE (POUR BTL) OPTIME
TOPICAL | Status: DC | PRN
  Administered 2022-12-04: 1000 mL

## 2022-12-04 MED ORDER — CELECOXIB 100 MG PO CAPS
100.0000 mg | ORAL_CAPSULE | Freq: Two times a day (BID) | ORAL | 2 refills | Status: AC
  Filled 2022-12-04: qty 30, 15d supply, fill #0

## 2022-12-04 MED ORDER — CLONIDINE HCL (ANALGESIA) 100 MCG/ML EP SOLN
EPIDURAL | Status: DC | PRN
  Administered 2022-12-04: 80 ug

## 2022-12-04 MED ORDER — PROPOFOL 1000 MG/100ML IV EMUL
INTRAVENOUS | Status: AC
  Filled 2022-12-04: qty 100

## 2022-12-04 MED ORDER — MIDAZOLAM HCL 2 MG/2ML IJ SOLN
INTRAMUSCULAR | Status: AC
  Filled 2022-12-04: qty 2

## 2022-12-04 MED ORDER — PROPOFOL 500 MG/50ML IV EMUL
INTRAVENOUS | Status: DC | PRN
  Administered 2022-12-04: 90 ug/kg/min via INTRAVENOUS

## 2022-12-04 MED ORDER — ISOPROPYL ALCOHOL 70 % SOLN
Status: DC | PRN
  Administered 2022-12-04: 1 via TOPICAL

## 2022-12-04 MED ORDER — HYDROMORPHONE HCL 1 MG/ML IJ SOLN
INTRAMUSCULAR | Status: AC
  Filled 2022-12-04: qty 1

## 2022-12-04 MED ORDER — PHENYLEPHRINE HCL-NACL 20-0.9 MG/250ML-% IV SOLN
INTRAVENOUS | Status: DC | PRN
  Administered 2022-12-04: 40 ug/min via INTRAVENOUS

## 2022-12-04 MED ORDER — FENTANYL CITRATE (PF) 100 MCG/2ML IJ SOLN
INTRAMUSCULAR | Status: AC
  Filled 2022-12-04: qty 2

## 2022-12-04 MED ORDER — TRANEXAMIC ACID-NACL 1000-0.7 MG/100ML-% IV SOLN
1000.0000 mg | INTRAVENOUS | Status: AC
  Administered 2022-12-04: 1000 mg via INTRAVENOUS
  Filled 2022-12-04: qty 100

## 2022-12-04 MED ORDER — BUPIVACAINE IN DEXTROSE 0.75-8.25 % IT SOLN
INTRATHECAL | Status: DC | PRN
  Administered 2022-12-04: 1.8 mL via INTRATHECAL

## 2022-12-04 MED ORDER — SODIUM CHLORIDE (PF) 0.9 % IJ SOLN
INTRAMUSCULAR | Status: DC | PRN
  Administered 2022-12-04: 60 mL

## 2022-12-04 MED ORDER — HYDROMORPHONE HCL 1 MG/ML IJ SOLN
0.2500 mg | INTRAMUSCULAR | Status: DC | PRN
  Administered 2022-12-04 (×2): 0.25 mg via INTRAVENOUS

## 2022-12-04 MED ORDER — BUPIVACAINE LIPOSOME 1.3 % IJ SUSP: 20.0000 mL | Freq: Once | INTRAMUSCULAR | Status: DC

## 2022-12-04 SURGICAL SUPPLY — 74 items
BAG COUNTER SPONGE SURGICOUNT (BAG) ×1 IMPLANT
BAG DECANTER FOR FLEXI CONT (MISCELLANEOUS) ×1 IMPLANT
BAG ZIPLOCK 12X15 (MISCELLANEOUS) ×1 IMPLANT
BLADE SAGITTAL 25.0X1.19X90 (BLADE) ×1 IMPLANT
BLADE SAW SGTL 11.0X1.19X90.0M (BLADE) ×1 IMPLANT
BLADE SURG SZ10 CARB STEEL (BLADE) ×1 IMPLANT
BNDG ELASTIC 6INX 5YD STR LF (GAUZE/BANDAGES/DRESSINGS) ×1 IMPLANT
BNDG ELASTIC 6X15 VLCR STRL LF (GAUZE/BANDAGES/DRESSINGS) IMPLANT
BOOTIES KNEE HIGH SLOAN (MISCELLANEOUS) ×1 IMPLANT
BOWL SMART MIX CTS (DISPOSABLE) ×1 IMPLANT
CEMENT HV SMART SET (Cement) ×2 IMPLANT
CHLORAPREP W/TINT 26 (MISCELLANEOUS) ×2 IMPLANT
COVER SURGICAL LIGHT HANDLE (MISCELLANEOUS) ×1 IMPLANT
CUFF TOURN SGL QUICK 34 (TOURNIQUET CUFF) ×1
CUFF TRNQT CYL 34X4.125X (TOURNIQUET CUFF) ×1 IMPLANT
DERMABOND ADVANCED .7 DNX12 (GAUZE/BANDAGES/DRESSINGS) IMPLANT
DRAPE INCISE IOBAN 66X45 STRL (DRAPES) ×1 IMPLANT
DRAPE SHEET LG 3/4 BI-LAMINATE (DRAPES) ×1 IMPLANT
DRAPE U-SHAPE 47X51 STRL (DRAPES) ×1 IMPLANT
DRSG AQUACEL AG ADV 3.5X10 (GAUZE/BANDAGES/DRESSINGS) ×1 IMPLANT
DRSG AQUACEL AG ADV 3.5X14 (GAUZE/BANDAGES/DRESSINGS) IMPLANT
ELECT REM PT RETURN 15FT ADLT (MISCELLANEOUS) ×1 IMPLANT
GLOVE BIO SURGEON STRL SZ8 (GLOVE) ×2 IMPLANT
GLOVE BIOGEL PI IND STRL 8 (GLOVE) ×2 IMPLANT
GOWN STRL REUS W/ TWL LRG LVL3 (GOWN DISPOSABLE) ×1 IMPLANT
GOWN STRL REUS W/TWL LRG LVL3 (GOWN DISPOSABLE) ×1
GOWN STRL SURGICAL XL XLNG (GOWN DISPOSABLE) IMPLANT
HANDPIECE INTERPULSE COAX TIP (DISPOSABLE) ×1
HDLS TROCR DRIL PIN KNEE 75 (PIN) ×1
HOLDER FOLEY CATH W/STRAP (MISCELLANEOUS) IMPLANT
HOOD PEEL AWAY T7 (MISCELLANEOUS) ×3 IMPLANT
INSERT TIBIA KNEE RIGHT 10 (Joint) IMPLANT
KIT TURNOVER KIT A (KITS) IMPLANT
MANIFOLD NEPTUNE II (INSTRUMENTS) ×1 IMPLANT
NDL HYPO 22X1.5 SAFETY MO (MISCELLANEOUS) ×1 IMPLANT
NEEDLE HYPO 22X1.5 SAFETY MO (MISCELLANEOUS) ×1 IMPLANT
NEEDLE SAFETY HYPO 22GAX1.5 (MISCELLANEOUS) ×1
NS IRRIG 1000ML POUR BTL (IV SOLUTION) ×1 IMPLANT
PACK TOTAL KNEE CUSTOM (KITS) ×1 IMPLANT
PAD ARMBOARD 7.5X6 YLW CONV (MISCELLANEOUS) ×1 IMPLANT
PENCIL SMOKE EVACUATOR (MISCELLANEOUS) IMPLANT
PIN DRILL HDLS TROCAR 75 4PK (PIN) IMPLANT
PROS FEM KNEE PS STD 9 RT (Joint) ×1 IMPLANT
PROSTHESIS FEM KNEE PS STD9 RT (Joint) IMPLANT
PROTECTOR NERVE ULNAR (MISCELLANEOUS) ×1 IMPLANT
SCREW FEMALE HEX FIX 25X2.5 (ORTHOPEDIC DISPOSABLE SUPPLIES) IMPLANT
SET HNDPC FAN SPRY TIP SCT (DISPOSABLE) ×1 IMPLANT
SET PAD KNEE POSITIONER (MISCELLANEOUS) ×1 IMPLANT
SOLUTION IRRIG SURGIPHOR (IV SOLUTION) IMPLANT
SPIKE FLUID TRANSFER (MISCELLANEOUS) ×2 IMPLANT
STEM TIBIA 5 DEG SZ F R KNEE (Knees) IMPLANT
STRIP CLOSURE SKIN 1/2X4 (GAUZE/BANDAGES/DRESSINGS) IMPLANT
SUT ETHIBOND NAB CT1 #1 30IN (SUTURE) ×2 IMPLANT
SUT MON AB 2-0 CT1 36 (SUTURE) ×2 IMPLANT
SUT MON AB 3-0 SH 27 (SUTURE) ×1
SUT MON AB 3-0 SH27 (SUTURE) ×1 IMPLANT
SUT PDS AB 1 CT1 27 (SUTURE) ×2 IMPLANT
SUT PDS AB 1 TP1 96 (SUTURE) ×1 IMPLANT
SUT VIC AB 0 CT1 27 (SUTURE)
SUT VIC AB 0 CT1 27XBRD ANTBC (SUTURE) IMPLANT
SUT VIC AB 0 CT1 36 (SUTURE) ×1 IMPLANT
SUT VIC AB 1 CT1 27 (SUTURE)
SUT VIC AB 1 CT1 27XBRD ANTBC (SUTURE) IMPLANT
SUT VIC AB 2-0 CT1 27 (SUTURE) ×1
SUT VIC AB 2-0 CT1 TAPERPNT 27 (SUTURE) ×1 IMPLANT
SUT VICRYL AB 3-0 FS1 BRD 27IN (SUTURE) ×1 IMPLANT
SUT VLOC 180 0 24IN GS25 (SUTURE) ×1 IMPLANT
SYR 10ML LL (SYRINGE) IMPLANT
TIBIA STEM 5 DEG SZ F R KNEE (Knees) ×1 IMPLANT
TRAY FOLEY MTR SLVR 16FR STAT (SET/KITS/TRAYS/PACK) IMPLANT
TUBE SUCTION HIGH CAP CLEAR NV (SUCTIONS) IMPLANT
WATER STERILE IRR 1000ML POUR (IV SOLUTION) ×1 IMPLANT
WRAP KNEE MAXI GEL POST OP (GAUZE/BANDAGES/DRESSINGS) ×1 IMPLANT
YANKAUER SUCT BULB TIP NO VENT (SUCTIONS) ×1 IMPLANT

## 2022-12-04 NOTE — H&P (Addendum)
PREOPERATIVE H&P  HPI: Derrick Tran is a 62 y.o. male who has presented today for surgery, with the diagnosis of bilateral knee osteoarthritis.  The right knee bothers him worse than the left knee.  The various methods of treatment have been discussed with the patient and family.  After consideration of risks, benefits, and other options for treatment, the patient has consented to RIGHT TOTAL KNEE ARTHROPLASTY as a surgical intervention.  The patient's history has been reviewed, patient examined, no change in status, stable for surgery.  I have reviewed the patient's chart and labs.  Questions were answered to the patient's satisfaction.    PMH: Past Medical History:  Diagnosis Date   Arthritis    Hyperlipidemia    Hypertension     Home Medications Allergies  No current facility-administered medications on file prior to encounter.   Current Outpatient Medications on File Prior to Encounter  Medication Sig Dispense Refill   aspirin EC 81 MG tablet Take 81 mg by mouth daily.     atorvastatin (LIPITOR) 40 MG tablet Take 40 mg by mouth every evening.     CALCIUM PO Take 1,000 mg by mouth daily.     finasteride (PROPECIA) 1 MG tablet Take 5 mg by mouth in the morning.     lisinopril (PRINIVIL,ZESTRIL) 40 MG tablet Take 1 tablet by mouth in the morning.     Multiple Vitamins-Minerals (MULTIVITAMIN ADULTS 50+) TABS Take 1 tablet by mouth daily.     Omega-3 Fatty Acids (FISH OIL) 1000 MG CAPS Take 1,000 mg by mouth daily.     albuterol (VENTOLIN HFA) 108 (90 Base) MCG/ACT inhaler Inhale 1-2 puffs into the lungs every 6 (six) hours as needed for wheezing or shortness of breath. 8 g 0   amoxicillin-clavulanate (AUGMENTIN) 875-125 MG tablet Take 1 tablet by mouth 2 (two) times daily.     tadalafil, PAH, (ADCIRCA) 20 MG tablet Take 20 mg by mouth daily as needed (ED).     No Known Allergies   PSH: Past Surgical History:  Procedure Laterality Date   COLONOSCOPY     ORIF HUMERUS FRACTURE Left  03/17/2020   Procedure: OPEN REDUCTION INTERNAL FIXATION (ORIF) proximal HUMERUS FRACTURE;  Surgeon: Tania Ade, MD;  Location: [facility];  Service: Orthopedics;  Laterality: Left;     Family History Social History  Family History  Problem Relation Age of Onset   Lung cancer Father    High blood pressure Father    Colon cancer Neg Hx    Colon polyps Neg Hx    Esophageal cancer Neg Hx    Rectal cancer Neg Hx    Stomach cancer Neg Hx     Social History   Socioeconomic History   Marital status: Married    Spouse name: Not on file   Number of children: Not on file   Years of education: Not on file   Highest education level: Not on file  Occupational History   Not on file  Tobacco Use   Smoking status: Never   Smokeless tobacco: Never  Vaping Use   Vaping Use: Never used  Substance and Sexual Activity   Alcohol use: Yes    Alcohol/week: 2.0 standard drinks of alcohol    Types: 2 Cans of beer per week   Drug use: Never   Sexual activity: Not on file  Other Topics Concern   Not on file  Social History Narrative   Not on file   Social Determinants of Health  Financial Resource Strain: Not on file  Food Insecurity: Not on file  Transportation Needs: Not on file  Physical Activity: Not on file  Stress: Not on file  Social Connections: Not on file     Review of Systems: MSK: As noted per HPI above GI: No current Nausea/vomiting ENT: Denies sore throat, epistaxis CV: Denies chest pain Resp: No current shortness of breath  Other than mentioned above, there are no Constitutional, Neurological, Psychiatric, ENT, Ophthalmological, Cardiovascular, Respiratory, GI, GU, Musculoskeletal, Integumentary, Lymphatic, Endocrine or Allergic issues.   Physical Examination: CV: Normal distal pulses Lungs: Unlabored respirations RLE: Examination of the right knee demonstrates skin intact and benign.  Minimal effusion.  Medial and lateral joint line tenderness, with tenderness  over the proximal tibia both medially and laterally as well as patella facets.  There is crepitation with range of motion.  Stable to varus and valgus stress in 30 degrees and 0 degrees.  No anterior posterior laxity.  Full quadriceps strength against manual resistance.  Range of motion is about 2 degrees to over 140 degrees.  Normal patella tracking.  Intact dorsiflexion, plantarflexion, EHL strength and function.  Sensation intact light touch in the superficial peroneal, deep peroneal, and tibial distributions.  Normal DP pulse.  Warm and well-perfused distally.  Assessment/Plan: RIGHT TOTAL KNEE ARTHROPLASTY   He is still hopeful to have both knees replaced as soon as possible.  He does understand that I will be relocating at the end of March 2024, and would like for me to take care of this for him.  He understands, however, that if we are unable to proceed with the left knee before I leave, I will refer him to another physician who will take care of this for him.   Derrick Tran M.D. Orthopaedic Surgery [facility]  Review of this patient's medications prescribed by other providers does not in any way constitute an endorsement by this clinician of their use, indications, dosage, route, efficacy, interactions, or other clinical parameters.  Portions of the record have been created with voice recognition software.  Grammatical and punctuation errors, random word insertions, wrong-word or "sound-a-like" substitutions, pronoun errors (inaccuracies and/or substitutions), and/or incomplete sentences may have occurred due to the inherent limitations of voice recognition software.  Not all errors are caught or corrected.  Although every attempt is made to root out erroneous and incomplete transcription, the note may still not fully represent the intent or opinion of the author.  Read the chart carefully and recognize, using context, where errors/substitutions have occurred.   Any questions or concerns about the content of this note or information contained within the body of this dictation should be addressed directly with the author for clarification.

## 2022-12-04 NOTE — Anesthesia Postprocedure Evaluation (Signed)
Anesthesia Post Note  Patient: Derrick Tran  Procedure(s) Performed: RIGHT TOTAL KNEE ARTHROPLASTY (Right: Knee)     Patient location during evaluation: PACU Anesthesia Type: Spinal Level of consciousness: awake and alert Pain management: pain level controlled Vital Signs Assessment: post-procedure vital signs reviewed and stable Respiratory status: spontaneous breathing Cardiovascular status: stable Anesthetic complications: no   No notable events documented.  Last Vitals:  Vitals:   12/04/22 1415 12/04/22 1430  BP: 114/77 117/78  Pulse: 69 84  Resp:    Temp:    SpO2: 99% 100%    Last Pain:  Vitals:   12/04/22 1400  TempSrc:   PainSc: 2                  Nolon Nations

## 2022-12-04 NOTE — Anesthesia Procedure Notes (Addendum)
Spinal  Patient location during procedure: OR Start time: 12/04/2022 7:30 AM End time: 12/04/2022 7:36 AM Reason for block: surgical anesthesia Staffing Performed: anesthesiologist  Anesthesiologist: Nolon Nations, MD Performed by: Nolon Nations, MD Authorized by: Nolon Nations, MD   Preanesthetic Checklist Completed: patient identified, IV checked, site marked, risks and benefits discussed, surgical consent, monitors and equipment checked, pre-op evaluation and timeout performed Spinal Block Patient position: sitting Prep: DuraPrep and site prepped and draped Patient monitoring: heart rate, continuous pulse ox and blood pressure Approach: midline Location: L3-4 Injection technique: single-shot Needle Needle type: Spinocan  Needle gauge: 25 G Needle length: 9 cm Additional Notes Expiration date of kit checked and confirmed. Patient tolerated procedure well, without complications.

## 2022-12-04 NOTE — Discharge Instructions (Signed)
Discharge instructions for Dr. Georgeanna Harrison, M.D.: Please refer to the two-sided discharge instructions paper that Dr. Mable Fill placed in the patient's paper chart. Please give this to the patient to take home after reviewing with the patient!!   General discharge instructions:  PLEASE REFER TO TWO-SIDED PAPER INSTRUCTIONS IN [place]!!!  Diet: As you were doing prior to hospitalization. Shower:  Unless otherwise specified (i.e. on two-sided paper instructions with paper chart) may shower but keep the wounds dry, use an occlusive plastic wrap, NO SOAKING IN TUB.  If the bandage gets wet, change with a clean dry gauze. Dressing:  Unless otherwise specified (i.e. on two-sided paper instructions with paper chart), may change your dressing 3-5 days after surgery.  Then change the dressing daily with sterile gauze dressing.  If there are sticky tapes (steri-strips) on your wounds and all the stitches are absorbable.  Leave the steri-strips in place when changing your dressings, they will peel off with time, usually 2-3 weeks. Activity:  Increase activity slowly as tolerated, but follow the restrictions on the two-sided paper discharge instructions sheet that Dr. Mable Fill placed in the paper chart.  No lifting or driving for 6 weeks. Weight Bearing: WEIGHTBEARING AS TOLERATED (WBAT) on the RIGHT LOWER EXTREMITY. To prevent constipation: You may use over-the-counter stool softener(s) such as Colace (over the counter) 100 mg by mouth twice a day and/or Miralax (over the counter) for constipation as needed.  Drink plenty of fluids (prune juice may be helpful) and high fiber foods.  Itching:  If you experience itching with your medications, try taking only a single pain pill, or even half a pain pill at a time.  You can also use benadryl over the counter for itching or also to help with sleep.  Precautions:  If you experience chest pain or shortness of breath - call 911 immediately for  transfer to the hospital emergency department!!  PLEASE REFER TO TWO-SIDED PAPER INSTRUCTIONS IN PAPER CHART FOR SPECIFIC INSTRUCTIONS!!!  If you develop a fever greater that 101.1 deg F, purulent drainage from wound, increased redness or drainage from wound, or calf pain -- Call the office at [phone].

## 2022-12-04 NOTE — Op Note (Signed)
OPERATIVE NOTE  Derrick Tran male 62 y.o. 12/04/2022  PREOPERATIVE DIAGNOSIS: Right knee osteoarthritis  POSTOPERATIVE DIAGNOSIS: Right knee osteoarthritis (M17.11)  PROCEDURE(S): Right total knee arthroplasty [id number])  SURGEON: Georgeanna Harrison, M.D.  ASSISTANT(S): RNFA  ANESTHESIA: Spinal  FINDINGS: Preoperative Examination: RLE: Examination of the right knee demonstrates skin intact and benign.  Minimal effusion.  Medial and lateral joint line tenderness, with tenderness over the proximal tibia both medially and laterally as well as patella facets.  There is crepitation with range of motion.  Stable to varus and valgus stress in 30 degrees and 0 degrees.  No anterior posterior laxity.  Full quadriceps strength against manual resistance.  Range of motion is about 2 degrees to over 140 degrees.  Normal patella tracking.  Intact dorsiflexion, plantarflexion, EHL strength and function.  Sensation intact light touch in the superficial peroneal, deep peroneal, and tibial distributions.  Normal DP pulse.  Warm and well-perfused distally.   Operative Findings: End-stage osteoarthritis with tricompartmental degenerative changes.  Total knee arthroplasty with cementless femoral component and cemented tibial component.  Patella not replaced.  Stable patellar tracking upon trialing and with implantation of final components.  Full range of motion and trialing and after implantation of final components.  Stable closure and repair of extensor mechanism.  IMPLANTS: Implant Name Type Inv. Item Serial No. Manufacturer Lot No. LRB No. Used Action  CEMENT HV SMART SET - [id number] Cement CEMENT HV SMART SET  DEPU[employer]TH[employer] [id number] Right 2 Implanted  TIBIA STEM 5 DEG SZ F R KNEE - [id number] Knees TIBIA STEM 5 DEG SZ F R KNEE  [employer](ORTH,TRAU,BIO,SG) [id number] Right 1 Implanted  PROS FEM KNEE PS STD 9 RT - [id number] Joint PROS FEM KNEE PS STD 9 RT  ZIMMER [employer]ON(ORTH,TRAU,BIO,SG) [id number] Right 1  Implanted  INSERT TIBIA KNEE RIGHT 10 - [id number] Joint INSERT TIBIA KNEE RIGHT 10  [employer]IMMER [employer]ON(ORTH,TRAU,BIO,SG) [id number] Right 1 Implanted    INDICATIONS:  The patient is a 62 y.o. male with end-stage bilateral knee osteoarthritis.  Right knee bothers him worse than the left knee.  He had persistent pain and symptoms despite extensive conservative treatment, did wish to pursue surgical treatment the form of total knee arthroplasty.  He understood the risks, benefits and alternatives to surgery which include but are not limited to bleeding, wound healing complications, infection, damage to surrounding structures, persistent pain, stiffness, lack of improvement, and need for subsequent surgeries, as well as complications related to anesthesia, cardiovascular complications, and death.  Additional risks specific to arthroplasty were discussed including periprosthetic infection, periprosthetic fracture, and dislocation/instability, as well as the possibility for limb length discrepancy.  He also understood the potential for continued pain, and that there were no guarantees of acceptable outcome.  After weighing these risks the patient opted to proceed with surgery.  TECHNIQUE: Patient was identified in the preoperative holding area.  The right knee was confirmed as the appropriate operative site and marked by me.  Consent was signed by myself and the patient and witnessed by the preoperative nurse.  Adductor canal block was performed by anesthesia in the preoperative holding area.  Patient was taken to the operative suite and placed supine on the operative table.  Anesthesia was induced by the anesthesia team.  The patient was positioned appropriately for the procedure and all bony prominences were well padded.  A non-sterile thigh tourniquet was placed on the operative extremity.  Preoperative antibiotics were given. The extremity was prepped and draped in the usual sterile fashion  and surgical timeout was  performed.  Tourniquet was used during the case.  Surface is marked out anteriorly.  Longitudinal incision centered over the knee was marked out.  Skin was incised sharply.  Underlying subcutaneous tissue and fat was dissected with Bovie electrocautery down onto the extensor mechanism layer.  Minimal lateral flap was raised with large medial flap.  Extensor mechanism was incised sharply with a fresh blade forming a medial parapatellar arthrotomy.  Fat pad superiorly under quadriceps tendon as well as upon patella tendon were excised with combination sharp excision regular cautery.  Ligaments muscle excised.  A release of the proximal MCL was performed sharply off the tibia, with release the coronary meniscal ligaments.  Patella was everted knee flexed up to 90 degrees.  Notch osteophytes were resected with rongeurs and osteotome.  ACL was resected.  Distal femur cut was made 5 degrees valgus with intramedullary guide.  PCL was recessed off of the tibia and tibia was translated anteriorly.  Medial and lateral menisci were excised.  Proximal tibia cut was made of the jig.  The distal femur was then sized with anterior fixing guide to a size 9.  Multiple cutting guide were drilled.  Cutting guide was impacted in position and the cut was checked with an angel wing verifying there was no anterior notching with the cut at size 9.  The distal femur cuts were then completed including anterior, posterior, anterior chamfer, and posterior chamfer cuts.  Trial components including a size 9 femur, size F tibia, and 10 poly were implanted and trialed.  This resulted in excellent patellar tracking and full range of motion in flexion and extension with no instability.  The center aspect of the patella tray was marked with Bovie after ranging the knee.  Locals for the femoral component were drilled through the trial.  All trial components were removed.  This point he was copiously irrigated, and hemostasis was obtained.  Exparel was  infiltrated in the posterior capsular regions of the knee.  Knee was then once again brought up into maximal flexion.  Cementless femoral component was impacted in position.  A cemented tibial component was impacted onto the proximal tibia after thoroughly drying the proximal tibia.  Knee was again trialed with a size 10 poly, resulting in excellent patellar tracking and stable throughout range of motion.  The event was allowed to cure with knee in full extension, excising excess cement circumferentially from around tibia.  While cement was drying, knee was irrigated with a diluted iodine solution, which was allowed to remain in knee until cement had cured.  This was then thoroughly irrigated out of the knee.  Final size 10 poly was implanted, once again resulting in excellent patellar tracking stable throughout range of motion.  Knee was then thoroughly irrigated and hemostasis was obtained.  Extensor mechanism was repaired with combination of interrupted figure-of-eight stitches with #1 Vicryl, followed by running locking looped #1 PDS.  This resulted in excellent repair of the extensor mechanism throughout range of motion.  Layered closure was then performed with simple inverted interrupted #0 Vicryl deep fat, followed by simple inverted erupted #2 Monocryl deep dermal, followed by running #3 Monocryl subcuticular.  She was with Dermabond and suture tails secured with Steri-Strips.  Aquacel dressing was placed over the wound.  A compression stocking was then placed on the extremity, followed by a long 6 inch Ace.  Ice pack was placed over the knee.  Patient was awakened from anesthesia and transferred to  PACU in stable condition.  He tolerated procedure well.  No complications noted intraoperatively.  POSTOPERATIVE INSTRUCTIONS: Mobility: Walker or crutches as needed for ambulation Pain control: Continue to wean/titrate to appropriate oral regimen DVT Prophylaxis: 81 mg aspirin twice daily for 2 weeks, then  once daily for 2 more weeks RLE: Weightbearing as tolerated Dressing care: Keep AQUACEL on and dry for up to 14 days.  Do not allow surgical area to get wet before that.  Remove AQUACEL dressing after 14 days and allow area to get wet in shower but DO NOT SUBMERGE until wound is evaluated in clinic.  In most cases skin glue is used and no additional dressing is necessary.  Disposition: Home Follow-up: Please call [facility] [phone]) to schedule follow-op appointment for 2 weeks after surgery.  TOURNIQUET TIME: * Missing tourniquet times found for documented tourniquets in log: [id number] *  BLOOD LOSS: 300 mL         DRAINS: None         SPECIMEN: None       COMPLICATIONS:  * No complications entered in OR log *         DISPOSITION: PACU - hemodynamically stable.         CONDITION: stable   Georgeanna Harrison M.D. Orthopaedic Surgery [facility]   Portions of the record have been created with voice recognition software.  Grammatical and punctuation errors, random word insertions, wrong-word or "sound-a-like" substitutions, pronoun errors (inaccuracies and/or substitutions), and/or incomplete sentences may have occurred due to the inherent limitations of voice recognition software.  Not all errors are caught or corrected.  Although every attempt is made to root out erroneous and incomplete transcription, the note may still not fully represent the intent or opinion of the author.  Read the chart carefully and recognize, using context, where errors/substitutions have occurred.  Any questions or concerns about the content of this note or information contained within the body of this dictation should be addressed directly with the author for clarification.

## 2022-12-04 NOTE — Transfer of Care (Signed)
Immediate Anesthesia Transfer of Care Note  Patient: Derrick Tran  Procedure(s) Performed: RIGHT TOTAL KNEE ARTHROPLASTY (Right: Knee)  Patient Location: PACU  Anesthesia Type:Spinal  Level of Consciousness: awake, alert , oriented, and patient cooperative  Airway & Oxygen Therapy: Patient Spontanous Breathing and Patient connected to face mask oxygen  Post-op Assessment: Report given to RN and Post -op Vital signs reviewed and stable  Post vital signs: Reviewed and stable  Last Vitals:  Vitals Value Taken Time  BP 105/69 12/04/22 1048  Temp    Pulse 69 12/04/22 1051  Resp    SpO2 98 % 12/04/22 1051  Vitals shown include unvalidated device data.  Last Pain:  Vitals:   12/04/22 0610  TempSrc:   PainSc: 0-No pain      Patients Stated Pain Goal: 3 ([id number])  Complications: No notable events documented.

## 2022-12-04 NOTE — Anesthesia Procedure Notes (Addendum)
Anesthesia Regional Block: Adductor canal block   Pre-Anesthetic Checklist: , timeout performed,  Correct Patient, Correct Site, Correct Laterality,  Correct Procedure, Correct Position, site marked,  Risks and benefits discussed,  Surgical consent,  Pre-op evaluation,  At surgeon's request and post-op pain management  Laterality: Lower and Right  Prep: chloraprep       Needles:  Injection technique: Single-shot  Needle Type: Stimiplex     Needle Length: 9cm  Needle Gauge: 21     Additional Needles:   Procedures:,,,, ultrasound used (permanent image in chart),,    Narrative:  Start time: 12/04/2022 6:54 AM End time: 12/04/2022 7:14 AM Injection made incrementally with aspirations every 5 mL.  Performed by: Personally  Anesthesiologist: Nolon Nations, MD  Additional Notes: BP cuff, EKG monitors applied. Sedation begun. Artery and nerve location verified with ultrasound. Anesthetic injected incrementally (50m), slowly, and after negative aspirations under direct u/s guidance. Good fascial/perineural spread. Tolerated well.

## 2022-12-04 NOTE — Evaluation (Signed)
Physical Therapy Evaluation Patient Details Name: Derrick Tran MRN: [id number] DOB: 1961/08/22 Today's Date: 12/04/2022  History of Present Illness  Pt is 62 yo male admitted 12/04/22 for R TKA.  Pt with hx including but not limited to arthritis and HTN  Clinical Impression  Pt is s/p TKA resulting in the deficits listed below (see PT Problem List). At baseline pt is independent, working, and enjoys [employer].  Pt seen in PACU for possible same day d/c after TKA.  Pt has support at home but does need a RW.  He demonstrated good quad activation, ROM, and fair pain control.  Pt was able to ambulate 52' with RW and performed stairs backward with RW to simulate home set up.   Pt demonstrates safe gait & transfers in order to return home from PT perspective once discharged by MD; however, he was still numb in groin area and unable to urinate during session (notified nursing) While in hospital, will continue to benefit from PT for skilled therapy to advance mobility and exercises.            Recommendations for follow up therapy are one component of a multi-disciplinary discharge planning process, led by the attending physician.  Recommendations may be updated based on patient status, additional functional criteria and insurance authorization.  Follow Up Recommendations Follow physician's recommendations for discharge plan and follow up therapies      Assistance Recommended at Discharge Intermittent Supervision/Assistance  Patient can return home with the following  A little help with bathing/dressing/bathroom;A little help with walking and/or transfers;Assistance with cooking/housework;Help with stairs or ramp for entrance    Equipment Recommendations Rolling walker (2 wheels)  Recommendations for Other Services       Functional Status Assessment Patient has had a recent decline in their functional status and demonstrates the ability to make significant improvements in function in a  reasonable and predictable amount of time.     Precautions / Restrictions Precautions Precautions: Fall Restrictions Weight Bearing Restrictions: Yes RLE Weight Bearing: Weight bearing as tolerated      Mobility  Bed Mobility Overal bed mobility: Needs Assistance Bed Mobility: Supine to Sit, Sit to Supine     Supine to sit: Supervision Sit to supine: Supervision        Transfers Overall transfer level: Needs assistance Equipment used: Rolling walker (2 wheels) Transfers: Sit to/from Stand Sit to Stand: Min guard           General transfer comment: Performed x 2; min guard for safety but no assist required; educated on safe hand placement and R L E management    Ambulation/Gait Ambulation/Gait assistance: Min guard Gait Distance (Feet): 80 Feet Assistive device: Rolling walker (2 wheels) Gait Pattern/deviations: Step-to pattern, Decreased stride length Gait velocity: decreased     General Gait Details: Slight decrease in weight shift to right but steady gait  Stairs Stairs: Yes Stairs assistance: Min guard, Min assist Stair Management: Two rails Number of Stairs: 7 General stair comments: Started wtih 2 rails and min guard pt able to perform but does not have rails at home.  Progressed to backward with RW and min A to stabilize RW.  Wife present and observed. Offered for wife to try with pt but he reports he is getting sore and feels comfortable.  Wife also reports her and their daughter will be there to assist.  Provided handout.  Wheelchair Mobility    Modified Rankin (Stroke Patients Only)       Balance  Overall balance assessment: Needs assistance Sitting-balance support: No upper extremity supported Sitting balance-Leahy Scale: Good     Standing balance support: Bilateral upper extremity supported, No upper extremity supported Standing balance-Leahy Scale: Fair Standing balance comment: Rw to ambulate but could stand without support                              Pertinent Vitals/Pain Pain Assessment Pain Assessment: 0-10 Pain Score: 6  Pain Location: R knee Pain Descriptors / Indicators: Discomfort Pain Intervention(s): Limited activity within patient's tolerance, Monitored during session    Home Living Family/patient expects to be discharged to:: Private residence Living Arrangements: Spouse/significant other Available Help at Discharge: Family;Available 24 hours/day Type of Home: House Home Access: Stairs to enter Entrance Stairs-Rails: None Entrance Stairs-Number of Steps: 3   Home Layout: One level Home Equipment: Shower seat - built in      Prior Function Prior Level of Function : Independent/Modified Independent;Working/employed;Driving                     Hand Dominance        Extremity/Trunk Assessment   Upper Extremity Assessment Upper Extremity Assessment: Overall WFL for tasks assessed    Lower Extremity Assessment Lower Extremity Assessment: LLE deficits/detail;RLE deficits/detail RLE Deficits / Details: Expected post op changes; ROM: knee 5 to 60 degrees limited by pain; MMT: ankle 5/5, hip and knee at least 3/5 but not further tested; no extensor lag with SLR LLE Deficits / Details: ROM WFL; MMT 5/5    Cervical / Trunk Assessment Cervical / Trunk Assessment: Normal Cervical / Trunk Exceptions: Pt had spinal and sensation returned in lower extremity and buttock.  However, upon standing reports groin/genitals still numb  Communication   Communication: No difficulties  Cognition Arousal/Alertness: Awake/alert Behavior During Therapy: WFL for tasks assessed/performed Overall Cognitive Status: Within Functional Limits for tasks assessed                                          General Comments  Educated on safe ice use, no pivots, car transfers, resting with leg straight, and TED hose during day. Also, encouraged walking every 1-2 hours during day. Educated on  HEP with focus on mobility the first weeks. Discussed doing exercises within pain control and if pain increasing could decreased ROM, reps, and stop exercises as needed. Encouraged to perform quad sets and ankle pumps frequently for blood flow and to promote full knee extension.     Exercises Total Joint Exercises Ankle Circles/Pumps: AROM, Both, 5 reps, Supine Quad Sets: AROM, Both, 5 reps, Supine Heel Slides: AROM, Right, 5 reps, Supine Hip ABduction/ADduction: AROM, Right, 5 reps, Supine Long Arc Quad: AROM, Right, 5 reps, Seated Knee Flexion: AROM, Right, 5 reps, Seated Goniometric ROM: R knee 5 to 60 Other Exercises Other Exercises: Educated on gait belt for AAROM as needed   Assessment/Plan    PT Assessment Patient needs continued PT services  PT Problem List Decreased strength;Pain;Decreased range of motion;Decreased activity tolerance;Decreased knowledge of use of DME;Decreased balance;Decreased mobility;Decreased knowledge of precautions       PT Treatment Interventions DME instruction;Therapeutic exercise;Balance training;Stair training;Gait training;Functional mobility training;Therapeutic activities;Patient/family education;Modalities    PT Goals (Current goals can be found in the Care Plan section)  Acute Rehab PT Goals Patient Stated Goal: return home today  PT Goal Formulation: With patient/family Time For Goal Achievement: 12/18/22 Potential to Achieve Goals: Good    Frequency 7X/week     Co-evaluation               AM-PAC PT "6 Clicks" Mobility  Outcome Measure Help needed turning from your back to your side while in a flat bed without using bedrails?: A Little Help needed moving from lying on your back to sitting on the side of a flat bed without using bedrails?: A Little Help needed moving to and from a bed to a chair (including a wheelchair)?: A Little Help needed standing up from a chair using your arms (e.g., wheelchair or bedside chair)?: A  Little Help needed to walk in hospital room?: A Little Help needed climbing 3-5 steps with a railing? : A Little 6 Click Score: 18    End of Session Equipment Utilized During Treatment: Gait belt Activity Tolerance: Patient tolerated treatment well Patient left: in bed;with call bell/phone within reach Nurse Communication: Mobility status;Other (comment) (passed PT but still numb in groin and unable to urinate) PT Visit Diagnosis: Other abnormalities of gait and mobility (R26.89);Muscle weakness (generalized) (M62.81)    Time: [id number] PT Time Calculation (min) (ACUTE ONLY): 27 min   Charges:   PT Evaluation $PT Eval Low Complexity: 1 Low PT Treatments $Gait Training: 8-22 mins        Abran Richard, PT Acute Rehab [facility] [phone]   Karlton Lemon 12/04/2022, 3:35 PM

## 2022-12-05 ENCOUNTER — Encounter (HOSPITAL_COMMUNITY): Payer: Self-pay | Admitting: Orthopedic Surgery
